# Patient Record
Sex: Female | Born: 1990 | Race: Black or African American | Hispanic: No | Marital: Married | Smoking: Former smoker
Health system: Southern US, Community
[De-identification: ages and names within clinical notes are randomized; demographics above are authoritative.]

## PROBLEM LIST (undated history)

## (undated) ENCOUNTER — Inpatient Hospital Stay (HOSPITAL_COMMUNITY): Payer: Self-pay

## (undated) DIAGNOSIS — Z789 Other specified health status: Secondary | ICD-10-CM

## (undated) DIAGNOSIS — Z8742 Personal history of other diseases of the female genital tract: Secondary | ICD-10-CM

## (undated) DIAGNOSIS — Z30017 Encounter for initial prescription of implantable subdermal contraceptive: Secondary | ICD-10-CM

## (undated) DIAGNOSIS — Z3046 Encounter for surveillance of implantable subdermal contraceptive: Secondary | ICD-10-CM

## (undated) HISTORY — DX: Personal history of other diseases of the female genital tract: Z87.42

## (undated) HISTORY — DX: Encounter for surveillance of implantable subdermal contraceptive: Z30.46

## (undated) HISTORY — DX: Encounter for initial prescription of implantable subdermal contraceptive: Z30.017

## (undated) HISTORY — DX: Other specified health status: Z78.9

## (undated) HISTORY — PX: KNEE SURGERY: SHX244

---
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
## 2011-03-01 ENCOUNTER — Other Ambulatory Visit: Payer: Self-pay | Admitting: Family Medicine

## 2011-03-01 ENCOUNTER — Other Ambulatory Visit (HOSPITAL_COMMUNITY)
Admission: RE | Admit: 2011-03-01 | Discharge: 2011-03-01 | Disposition: A | Discharge status: Home / Self Care | Payer: 59 | Source: Ambulatory Visit | Attending: Obstetrics and Gynecology | Admitting: Obstetrics and Gynecology

## 2011-03-01 DIAGNOSIS — Z113 Encounter for screening for infections with a predominantly sexual mode of transmission: Secondary | ICD-10-CM | POA: Insufficient documentation

## 2011-03-01 DIAGNOSIS — Z01419 Encounter for gynecological examination (general) (routine) without abnormal findings: Secondary | ICD-10-CM | POA: Insufficient documentation

## 2013-10-15 ENCOUNTER — Other Ambulatory Visit (HOSPITAL_COMMUNITY)
Admission: RE | Admit: 2013-10-15 | Discharge: 2013-10-15 | Disposition: A | Discharge status: Home / Self Care | Payer: Medicaid Other | Source: Ambulatory Visit | Attending: Advanced Practice Midwife | Admitting: Advanced Practice Midwife

## 2013-10-15 ENCOUNTER — Ambulatory Visit (INDEPENDENT_AMBULATORY_CARE_PROVIDER_SITE_OTHER): Payer: Medicaid Other | Admitting: Advanced Practice Midwife

## 2013-10-15 ENCOUNTER — Encounter: Payer: Self-pay | Admitting: Advanced Practice Midwife

## 2013-10-15 VITALS — BP 130/80 | Ht 62.5 in | Wt 152.0 lb

## 2013-10-15 DIAGNOSIS — Z113 Encounter for screening for infections with a predominantly sexual mode of transmission: Secondary | ICD-10-CM | POA: Insufficient documentation

## 2013-10-15 DIAGNOSIS — Z3049 Encounter for surveillance of other contraceptives: Secondary | ICD-10-CM

## 2013-10-15 DIAGNOSIS — Z01419 Encounter for gynecological examination (general) (routine) without abnormal findings: Secondary | ICD-10-CM | POA: Insufficient documentation

## 2013-10-15 MED ORDER — NORETHIN ACE-ETH ESTRAD-FE 1-20 MG-MCG(24) PO TABS: 1.0000 | ORAL_TABLET | Freq: Every day | ORAL | Status: DC

## 2013-10-15 NOTE — Progress Notes (Signed)
Morgan Conrad 22 y.o.  Filed Vitals:   10/15/13 1337  BP: 130/80     Past Medical History: History reviewed. No pertinent past medical history.  Past Surgical History: Past Surgical History  Procedure Laterality Date  . Knee surgery Right     Family History: Family History  Problem Relation Age of Onset  . Hypertension Maternal Grandfather   . Diabetes Maternal Grandfather     Social History: History  Substance Use Topics  . Smoking status: Never Smoker   . Smokeless tobacco: Never Used  . Alcohol Use: No    Allergies: No Known Allergies   History of Present Illness: Presents with vaginal bleeding since 10/31. Sometimes a heavier flow than others. Denies passing any clots or abdominal pain. Nexplanon since April 2012, has never had this problem before. Patient interested in having the implant removed and placed on pills. Getting married in the near future and interested in starting a family.    Current reviewed in [facility] electronic medical record.     Review of Systems   Patient denies any headaches, blurred vision, shortness of breath, chest pain, abdominal pain, problems with bowel movements, urination, or intercourse.   Physical Exam: General:  Well developed, well nourished, no acute distress Skin:  Warm and dry Neck:  Midline trachea, normal thyroid Lungs; Clear to auscultation bilaterally Breast:  No dominant palpable mass, retraction, or nipple discharge Cardiovascular: Regular rate and rhythm Abdomen:  Soft, non tender, no hepatosplenomegaly Pelvic:  External genitalia is normal in appearance.  The vagina is normal in appearance.The cervix is bulbous.  Uterus is felt to be normal size, shape, and contour.  No adnexal masses or tenderness noted. Rectal: Good sphincter tone, no polyps, or hemorrhoids felt.   Extremities:  No swelling or varicosities noted Psych:  No mood changes   Impression: Abnormal uterine bleeding due to  Nexplanon     Plan: Discussed Meg[facility] vs. removing Nexplanon. Patient desires to have implant removed and start pills Lo loestrin sent to Pharmacy Pap smear collected.  GC/Chlamydia pending HIV and RPR drawn F/u asap for Nexplanon removal.

## 2013-10-16 LAB — HIV ANTIBODY (ROUTINE TESTING W REFLEX): HIV: NONREACTIVE

## 2013-10-21 ENCOUNTER — Encounter: Payer: Self-pay | Admitting: Adult Health

## 2013-10-21 ENCOUNTER — Ambulatory Visit (INDEPENDENT_AMBULATORY_CARE_PROVIDER_SITE_OTHER): Payer: Medicaid Other | Admitting: Adult Health

## 2013-10-21 VITALS — BP 108/52 | Ht 62.5 in | Wt 156.0 lb

## 2013-10-21 DIAGNOSIS — Z3046 Encounter for surveillance of implantable subdermal contraceptive: Secondary | ICD-10-CM | POA: Insufficient documentation

## 2013-10-21 HISTORY — DX: Encounter for surveillance of implantable subdermal contraceptive: Z30.46

## 2013-10-21 NOTE — Progress Notes (Signed)
Subjective:     Patient ID: Morgan Conrad, female   DOB: [birth year], 22 y.o.   MRN: [id number]  HPI Morgan Conrad is a 22 year old black female in to get nexplanon removed,she is already on the pill.  Review of Systems See HPI Reviewed past medical,surgical, social and family history. Reviewed medications and allergies.     Objective:   Physical Exam BP 108/52  Ht 5' 2.5" (1.588 m)  Wt 156 lb (70.761 kg)  BMI 28.06 kg/m2  LMP 10/31/2014Verbal consent obtained to remove nexplanon.  Left arm cleansed with betadine, and injected with 1.5 cc 2% lidocaine and waited til numb.Under sterile technique a #11 blade was used to make small vertical incision, and a curved forceps was used to easily remove rod. Steri strips applied. Pressure dressing applied. Assessment:     Nexplanon removal    Plan:    Use condoms x 4 weeks, keep clean and dry x 24 hours, no heavy lifting, keep steri strips on x 72 hours, Keep pressure dressing on x 24 hours. Follow up prn problems.

## 2013-10-21 NOTE — Patient Instructions (Signed)
Use condoms x 4 weeks, keep clean and dry x 24 hours, no heavy lifting, keep steri strips on x 72 hours, Keep pressure dressing on x 24 hours. Follow up prn problems.  

## 2013-10-22 ENCOUNTER — Other Ambulatory Visit: Payer: Self-pay | Admitting: Advanced Practice Midwife

## 2013-10-22 ENCOUNTER — Ambulatory Visit: Payer: Self-pay | Admitting: Advanced Practice Midwife

## 2013-12-04 ENCOUNTER — Encounter: Payer: Self-pay | Admitting: Nurse Practitioner

## 2013-12-04 ENCOUNTER — Ambulatory Visit (INDEPENDENT_AMBULATORY_CARE_PROVIDER_SITE_OTHER): Payer: Medicaid Other | Admitting: Nurse Practitioner

## 2013-12-04 VITALS — Ht 62.5 in | Wt 160.0 lb

## 2013-12-04 DIAGNOSIS — N912 Amenorrhea, unspecified: Secondary | ICD-10-CM

## 2013-12-04 DIAGNOSIS — N926 Irregular menstruation, unspecified: Secondary | ICD-10-CM

## 2013-12-04 LAB — HCG, QUANTITATIVE, PREGNANCY: hCG, Beta Chain, Quant, S: 4335 m[IU]/mL

## 2013-12-04 LAB — POCT URINE PREGNANCY: Preg Test, Ur: POSITIVE

## 2013-12-05 ENCOUNTER — Telehealth: Payer: Self-pay | Admitting: Family Medicine

## 2013-12-05 NOTE — Telephone Encounter (Signed)
Patient notified and verbalized understanding of results. She is going to [facility]

## 2013-12-05 NOTE — Telephone Encounter (Signed)
Note has been sent to nurses box

## 2013-12-05 NOTE — Progress Notes (Signed)
Patient notified and verbalized understanding of results. She is going to [facility] 

## 2013-12-05 NOTE — Telephone Encounter (Signed)
Patient calling to get results to her lab work from yesterday.

## 2013-12-06 ENCOUNTER — Encounter: Payer: Self-pay | Admitting: Nurse Practitioner

## 2013-12-06 NOTE — Progress Notes (Signed)
Subjective:  Presents for complaints of no menstrual cycle since 10/31. Patient had her Nexplanon removed on 11/25. Has been having unprotected sex since then. Same sexual partner. No fever vaginal discharge. No pelvic pain.  Objective:   Ht 5' 2.5" (1.588 m)  Wt 160 lb (72.576 kg)  BMI 28.78 kg/m2  LMP 09/26/2013 NAD. Alert, oriented. Lungs clear. Heart regular rhythm. Abdomen soft nondistended nontender. Urine hCG positive.  Assessment:Absence of menstruation - Plan: POCT urine pregnancy, hCG, quantitative, pregnancy  Plan: Strongly recommend the patient quit smoking. Will obtain quantitative hCG today. Patient plans to continue pregnancy. Discussed early prenatal issues including starting her prenatal vitamins. Patient to set up appointment to begin her prenatal care.

## 2013-12-23 ENCOUNTER — Encounter: Payer: Self-pay | Admitting: Adult Health

## 2013-12-23 ENCOUNTER — Encounter (INDEPENDENT_AMBULATORY_CARE_PROVIDER_SITE_OTHER): Payer: Self-pay

## 2013-12-23 ENCOUNTER — Ambulatory Visit (INDEPENDENT_AMBULATORY_CARE_PROVIDER_SITE_OTHER): Payer: Self-pay | Admitting: Adult Health

## 2013-12-23 VITALS — BP 130/80 | Ht 62.5 in | Wt 162.0 lb

## 2013-12-23 DIAGNOSIS — Z3490 Encounter for supervision of normal pregnancy, unspecified, unspecified trimester: Secondary | ICD-10-CM

## 2013-12-23 DIAGNOSIS — Z640 Problems related to unwanted pregnancy: Secondary | ICD-10-CM

## 2013-12-23 NOTE — Patient Instructions (Signed)
Numbers given for abortion clinics Call prn or make appt if changes mind

## 2013-12-23 NOTE — Progress Notes (Signed)
Subjective:     Patient ID: Morgan Conrad, female   DOB: 10-Apr-1991, 23 y.o.   MRN: [id number]  HPI Morgan Conrad is a 23 year old black female, with unexpected pregnancy and she thinks she wants to terminate,She had nexplanon removed in November and was on the pill but was late taking them, her LMP was !12/30/12.  Review of Systems See HPI Reviewed past medical,surgical, social and family history. Reviewed medications and allergies.     Objective:   Physical Exam BP 130/80  Ht 5' 2.5" (1.588 m)  Wt 162 lb (73.483 kg)  BMI 29.14 kg/m2  LMP 10/29/2013   US showed IUP with CRL 1.27 cm 7+5 weeks matches EDD with LMP 08/05/14 had FHR 167 and ovarian cyst noted.Numbers given for several abortion clinic for her to call. Mom with her and supportive.  Assessment:     Pregnant, may terminate    Plan:    call clinics to talk with them about procedure Call prn or make appt if changes mind

## 2013-12-24 ENCOUNTER — Telehealth: Payer: Self-pay | Admitting: Adult Health

## 2013-12-24 NOTE — Telephone Encounter (Signed)
Mom called pt going to [facility] Friday to get the pill to terminate.Will cost about $400

## 2013-12-29 ENCOUNTER — Other Ambulatory Visit: Payer: Medicaid Other

## 2014-01-09 ENCOUNTER — Ambulatory Visit (INDEPENDENT_AMBULATORY_CARE_PROVIDER_SITE_OTHER): Payer: Self-pay | Admitting: Adult Health

## 2014-01-09 ENCOUNTER — Encounter: Payer: Self-pay | Admitting: Adult Health

## 2014-01-09 ENCOUNTER — Ambulatory Visit: Payer: Medicaid Other | Admitting: Obstetrics and Gynecology

## 2014-01-09 VITALS — BP 120/68 | Ht 62.5 in | Wt 160.5 lb

## 2014-01-09 DIAGNOSIS — Z8742 Personal history of other diseases of the female genital tract: Secondary | ICD-10-CM

## 2014-01-09 HISTORY — DX: Personal history of other diseases of the female genital tract: Z87.42

## 2014-01-09 LAB — POCT URINE PREGNANCY: Preg Test, Ur: POSITIVE

## 2014-01-09 NOTE — Progress Notes (Signed)
Subjective:     Patient ID: Morgan Conrad, female   DOB: 08-01-1991, 23 y.o.   MRN: [id number]  HPI Morgan Conrad is a 23 year old black female who was seen in [place] and took abortion pill on 130/15 and had bleeding and passed tissue and is taking Minastrin.No complaints, no sex yet, he is in basic training. Blood type O+ per pt.  Review of Systems See HPI Reviewed past medical,surgical, social and family history. Reviewed medications and allergies.     Objective:   Physical Exam BP 120/68  Ht 5' 2.5" (1.588 m)  Wt 160 lb 8 oz (72.802 kg)  BMI 28.87 kg/m2  LMP 10/29/2013  Breastfeeding? UnknownUPT lightly positive, Skin warm and dry.Pelvic: external genitalia is normal in appearance, vagina: white discharge without odor, cervix:smooth, uterus: normal size, shape and contour, non tender, no masses felt, adnexa: no masses or tenderness noted.     Assessment:     Sp termination of pregnancy    Plan:     Continue the minastrin  Follow up in 2 weeks for UPT

## 2014-01-09 NOTE — Patient Instructions (Signed)
Take pills daily Follow up in 2 weeks

## 2014-01-23 ENCOUNTER — Ambulatory Visit (INDEPENDENT_AMBULATORY_CARE_PROVIDER_SITE_OTHER): Payer: Medicaid Other | Admitting: Adult Health

## 2014-01-23 ENCOUNTER — Encounter: Payer: Self-pay | Admitting: Adult Health

## 2014-01-23 VITALS — BP 110/70 | Ht 62.5 in | Wt 157.0 lb

## 2014-01-23 DIAGNOSIS — Z3009 Encounter for other general counseling and advice on contraception: Secondary | ICD-10-CM

## 2014-01-23 DIAGNOSIS — Z3201 Encounter for pregnancy test, result positive: Secondary | ICD-10-CM

## 2014-01-23 DIAGNOSIS — Z8742 Personal history of other diseases of the female genital tract: Secondary | ICD-10-CM

## 2014-01-23 NOTE — Progress Notes (Signed)
Subjective:     Patient ID: Morgan Conrad, female   DOB: [birth year], 23 y.o.   MRN: [id number]  HPI Morgan Conrad is back for pregnancy test, sp termination, was lightly + 2 weeks ago.No complaints,Mom said she is doing well.  Review of Systems See HPI Reviewed past medical,surgical, social and family history. Reviewed medications and allergies.     Objective:   Physical Exam BP 110/70  Ht 5' 2.5" (1.588 m)  Wt 157 lb (71.215 kg)  BMI 28.24 kg/m2UPT lightly positive still,   still taking OCs, will get [facility]  Assessment:     Sp termination    Plan:     Check QHCG, will talk Monday  Continue OCs Return in 1 week for follow up [facility]

## 2014-01-23 NOTE — Patient Instructions (Signed)
Return in 1 week for labs Continue OCs

## 2014-01-24 LAB — HCG, QUANTITATIVE, PREGNANCY: hCG, Beta Chain, Quant, S: 28.9 m[IU]/mL

## 2014-01-26 ENCOUNTER — Telehealth: Payer: Self-pay | Admitting: Adult Health

## 2014-01-26 LAB — POCT URINE PREGNANCY: Preg Test, Ur: POSITIVE

## 2014-01-26 NOTE — Telephone Encounter (Signed)
Pt aware QHCG 28.9 which is good sp el ab

## 2014-01-30 ENCOUNTER — Other Ambulatory Visit: Payer: Medicaid Other

## 2014-01-30 ENCOUNTER — Encounter (INDEPENDENT_AMBULATORY_CARE_PROVIDER_SITE_OTHER): Payer: Self-pay

## 2014-01-30 DIAGNOSIS — O039 Complete or unspecified spontaneous abortion without complication: Secondary | ICD-10-CM

## 2014-01-31 LAB — HCG, QUANTITATIVE, PREGNANCY: hCG, Beta Chain, Quant, S: 16.4 m[IU]/mL

## 2014-02-02 ENCOUNTER — Telehealth: Payer: Self-pay | Admitting: Adult Health

## 2014-02-02 NOTE — Telephone Encounter (Signed)
No answer

## 2014-07-24 ENCOUNTER — Other Ambulatory Visit: Payer: Self-pay | Admitting: Obstetrics and Gynecology

## 2014-07-24 DIAGNOSIS — O3680X0 Pregnancy with inconclusive fetal viability, not applicable or unspecified: Secondary | ICD-10-CM

## 2014-07-29 ENCOUNTER — Other Ambulatory Visit: Payer: Self-pay | Admitting: Obstetrics and Gynecology

## 2014-07-29 ENCOUNTER — Ambulatory Visit (INDEPENDENT_AMBULATORY_CARE_PROVIDER_SITE_OTHER)

## 2014-07-29 DIAGNOSIS — O26849 Uterine size-date discrepancy, unspecified trimester: Secondary | ICD-10-CM

## 2014-07-29 DIAGNOSIS — O3680X0 Pregnancy with inconclusive fetal viability, not applicable or unspecified: Secondary | ICD-10-CM

## 2014-07-29 NOTE — Progress Notes (Signed)
U/S-single IUP with +FCA noted FHR-99 bpm, CRL c/w 5+3wks EDD 03/28/2015, cx appears closed, bilateral adnexa appears WNL

## 2014-08-12 ENCOUNTER — Ambulatory Visit (INDEPENDENT_AMBULATORY_CARE_PROVIDER_SITE_OTHER): Payer: Medicaid Other | Admitting: Women's Health

## 2014-08-12 ENCOUNTER — Encounter: Payer: Self-pay | Admitting: Women's Health

## 2014-08-12 VITALS — BP 112/62 | Wt 147.0 lb

## 2014-08-12 DIAGNOSIS — Z3481 Encounter for supervision of other normal pregnancy, first trimester: Secondary | ICD-10-CM

## 2014-08-12 DIAGNOSIS — Z331 Pregnant state, incidental: Secondary | ICD-10-CM | POA: Diagnosis not present

## 2014-08-12 DIAGNOSIS — Z1389 Encounter for screening for other disorder: Secondary | ICD-10-CM | POA: Diagnosis not present

## 2014-08-12 DIAGNOSIS — Z0184 Encounter for antibody response examination: Secondary | ICD-10-CM

## 2014-08-12 DIAGNOSIS — Z0189 Encounter for other specified special examinations: Secondary | ICD-10-CM

## 2014-08-12 DIAGNOSIS — Z36 Encounter for antenatal screening of mother: Secondary | ICD-10-CM

## 2014-08-12 DIAGNOSIS — Z3480 Encounter for supervision of other normal pregnancy, unspecified trimester: Secondary | ICD-10-CM | POA: Diagnosis not present

## 2014-08-12 DIAGNOSIS — Z130 Encounter for screening for diseases of the blood and blood-forming organs and certain disorders involving the immune mechanism: Secondary | ICD-10-CM

## 2014-08-12 DIAGNOSIS — O210 Mild hyperemesis gravidarum: Secondary | ICD-10-CM | POA: Diagnosis not present

## 2014-08-12 DIAGNOSIS — O09899 Supervision of other high risk pregnancies, unspecified trimester: Secondary | ICD-10-CM | POA: Insufficient documentation

## 2014-08-12 DIAGNOSIS — Z1159 Encounter for screening for other viral diseases: Secondary | ICD-10-CM

## 2014-08-12 DIAGNOSIS — Z1371 Encounter for nonprocreative screening for genetic disease carrier status: Secondary | ICD-10-CM

## 2014-08-12 LAB — POCT URINALYSIS DIPSTICK
Blood, UA: NEGATIVE
Glucose, UA: NEGATIVE
Ketones, UA: NEGATIVE
Leukocytes, UA: NEGATIVE
Nitrite, UA: NEGATIVE
Protein, UA: NEGATIVE

## 2014-08-12 NOTE — Progress Notes (Signed)
  Subjective:  Morgan Conrad is a 23 y.o. G60P0010 African American female at [redacted]w[redacted]d by 5wk u/s, being seen today for her first obstetrical visit.  Her obstetrical history is significant for tab x 1.  Pregnancy history fully reviewed.  Patient reports some nausea when hungry- improves when she eats- doesn't bother her. Denies vb, cramping, uti s/s, abnormal/malodorous vag d/c, or vulvovaginal itching/irritation.  Currently exercises, runs, zumba- ok to continue everything she has previously been doing- avoid anything that may potentially cause trauma to abd/or cause falls  Drinks 'dieters tea', helps her have bm's- advised to stop. Wears abdominal binder 'to help stomach stay flat'- advised to stop wearing.   BP 112/62  Wt 147 lb (66.679 kg)  LMP 06/15/2014  HISTORY: OB History  Gravida Para Term Preterm AB SAB TAB Ectopic Multiple Living  2    1  1        # Outcome Date GA Lbr Len/2nd Weight Sex Delivery Anes PTL Lv  2 CUR           1 TAB              Past Medical History  Diagnosis Date  . Encounter for Nexplanon removal 10/21/2013  . History of termination of pregnancy 01/09/2014  . Medical history non-contributory    Past Surgical History  Procedure Laterality Date  . Knee surgery Right    Family History  Problem Relation Age of Onset  . Hypertension Maternal Grandfather   . Diabetes Maternal Grandfather     Exam   System:     General: Well developed & nourished, no acute distress   Skin: Warm & dry, normal coloration and turgor, no rashes   Neurologic: Alert & oriented, normal mood   Cardiovascular: Regular rate & rhythm   Respiratory: Effort & rate normal, LCTAB, acyanotic   Abdomen: Soft, non tender   Extremities: normal strength, tone   Pelvic Exam:    Perineum: Normal perineum   Vulva: Normal, no lesions   Vagina:  Normal mucosa, normal discharge   Cervix: Normal, bulbous, appears closed   Uterus: Normal size/shape/contour for GA   Thin prep pap smear Neg  09/2013 FHR: will see tasha for fhr prior to leaving   Assessment:   Pregnancy: G2P0010 Patient Active Problem List   Diagnosis Date Noted  . Supervision of other normal pregnancy 08/12/2014    Priority: High  . History of termination of pregnancy 01/09/2014  . SUBLUXATION PATELLAR (MALALIGNMENT) 08/11/2008    [redacted]w[redacted]d G2P0010 New OB visit Mild nausea    Plan:  Initial labs drawn Continue prenatal vitamins Problem list reviewed and updated Reviewed n/v relief measures and warning s/s to report Constipation prevention/relief measures given in lieu of 'dieters tea' she was using Reviewed recommended weight gain based on pre-gravid BMI Encouraged well-balanced diet Genetic Screening discussed Integrated Screen: requested Cystic fibrosis screening discussed requested Ultrasound discussed; fetal survey: requested Follow up in 4 1/2 weeks for 1st it/nt and visit [facility] completed  [redacted]w[redacted]d CNM, [facility] 08/12/2014 11:14 AM

## 2014-08-12 NOTE — Patient Instructions (Addendum)

## 2014-08-13 LAB — URINALYSIS, ROUTINE W REFLEX MICROSCOPIC
Bilirubin Urine: NEGATIVE
GLUCOSE, UA: NEGATIVE mg/dL
Hgb urine dipstick: NEGATIVE
Ketones, ur: NEGATIVE mg/dL
LEUKOCYTES UA: NEGATIVE
NITRITE: NEGATIVE
PROTEIN: NEGATIVE mg/dL
Specific Gravity, Urine: 1.026 (ref 1.005–1.030)
Urobilinogen, UA: 0.2 mg/dL (ref 0.0–1.0)
pH: 6 (ref 5.0–8.0)

## 2014-08-13 LAB — CYSTIC FIBROSIS DIAGNOSTIC STUDY

## 2014-08-13 LAB — DRUG SCREEN, URINE, NO CONFIRMATION
Amphetamine Screen, Ur: NEGATIVE
Barbiturate Quant, Ur: NEGATIVE
Benzodiazepines.: NEGATIVE
Cocaine Metabolites: NEGATIVE
Creatinine,U: 422.1 mg/dL
Marijuana Metabolite: NEGATIVE
Methadone: NEGATIVE
Opiate Screen, Urine: NEGATIVE
Phencyclidine (PCP): NEGATIVE
Propoxyphene: NEGATIVE

## 2014-08-13 LAB — GC/CHLAMYDIA PROBE AMP
CT Probe RNA: NEGATIVE
GC Probe RNA: NEGATIVE

## 2014-08-13 LAB — CBC
HCT: 40.9 % (ref 36.0–46.0)
Hemoglobin: 13.4 g/dL (ref 12.0–15.0)
MCH: 29.2 pg (ref 26.0–34.0)
MCHC: 32.8 g/dL (ref 30.0–36.0)
MCV: 89.1 fL (ref 78.0–100.0)
Platelets: 327 10*3/uL (ref 150–400)
RBC: 4.59 MIL/uL (ref 3.87–5.11)
RDW: 13.2 % (ref 11.5–15.5)
WBC: 8.7 10*3/uL (ref 4.0–10.5)

## 2014-08-13 LAB — SICKLE CELL SCREEN: Sickle Cell Screen: NEGATIVE

## 2014-08-13 LAB — ANTIBODY SCREEN: Antibody Screen: NEGATIVE

## 2014-08-13 LAB — HIV ANTIBODY (ROUTINE TESTING W REFLEX): HIV 1&2 Ab, 4th Generation: NONREACTIVE

## 2014-08-13 LAB — RUBELLA SCREEN: Rubella: 1.13 Index — ABNORMAL HIGH (ref <0.90)

## 2014-08-13 LAB — VARICELLA ZOSTER ANTIBODY, IGG: Varicella IgG: 617 Index — ABNORMAL HIGH (ref <135.00)

## 2014-08-13 LAB — ABO AND RH: Rh Type: POSITIVE

## 2014-08-13 LAB — RPR

## 2014-08-13 LAB — URINE CULTURE
COLONY COUNT: NO GROWTH
Organism ID, Bacteria: NO GROWTH

## 2014-08-13 LAB — OXYCODONE SCREEN, UA, RFLX CONFIRM: Oxycodone Screen, Ur: NEGATIVE ng/mL

## 2014-08-13 LAB — HEPATITIS B SURFACE ANTIGEN: Hepatitis B Surface Ag: NEGATIVE

## 2014-08-17 ENCOUNTER — Encounter: Payer: Self-pay | Admitting: Women's Health

## 2014-09-09 ENCOUNTER — Ambulatory Visit (INDEPENDENT_AMBULATORY_CARE_PROVIDER_SITE_OTHER): Payer: Medicaid Other | Admitting: Women's Health

## 2014-09-09 ENCOUNTER — Ambulatory Visit (INDEPENDENT_AMBULATORY_CARE_PROVIDER_SITE_OTHER)

## 2014-09-09 VITALS — BP 102/70 | Wt 151.0 lb

## 2014-09-09 DIAGNOSIS — Z3682 Encounter for antenatal screening for nuchal translucency: Secondary | ICD-10-CM

## 2014-09-09 DIAGNOSIS — Z1389 Encounter for screening for other disorder: Secondary | ICD-10-CM | POA: Diagnosis not present

## 2014-09-09 DIAGNOSIS — Z3491 Encounter for supervision of normal pregnancy, unspecified, first trimester: Secondary | ICD-10-CM

## 2014-09-09 DIAGNOSIS — Z36 Encounter for antenatal screening of mother: Secondary | ICD-10-CM | POA: Diagnosis not present

## 2014-09-09 DIAGNOSIS — Z331 Pregnant state, incidental: Secondary | ICD-10-CM

## 2014-09-09 LAB — POCT URINALYSIS DIPSTICK
Blood, UA: NEGATIVE
Glucose, UA: NEGATIVE
LEUKOCYTES UA: NEGATIVE
Nitrite, UA: NEGATIVE
Protein, UA: NEGATIVE

## 2014-09-09 NOTE — Progress Notes (Addendum)
Low-risk OB appointment G2P0010 [redacted]w[redacted]d Estimated Date of Delivery: 03/28/15 BP 102/70  Wt 151 lb (68.493 kg)  LMP 06/15/2014  BP, weight, and urine reviewed.  Refer to obstetrical flow sheet for FH & FHR.  No fm yet. Denies cramping, lof, vb, or uti s/s. No complaints. Reviewed today's normal nt u/s, warning s/s to report. Plan:  Continue routine obstetrical care  F/U in 4wks for OB appointment, 2nd IT 1st it/nt today [facility] offered, accepted, referral faxed

## 2014-09-09 NOTE — Progress Notes (Signed)
U/S(11+3wks)-single active fetus, CRL c/w dates, NB present, NT-1.1mm, FHR- 176 bpm, cx appears closed (3.1cm), bilateral adnexa appears WNL, anterior Gr 0 placenta

## 2014-09-09 NOTE — Patient Instructions (Signed)
First Trimester of Pregnancy The first trimester of pregnancy is from week 1 until the end of week 12 (months 1 through 3). A week after a sperm fertilizes an egg, the egg will implant on the wall of the uterus. This embryo will begin to develop into a baby. Genes from you and your partner are forming the baby. The female genes determine whether the baby is a boy or a girl. At 6-8 weeks, the eyes and face are formed, and the heartbeat can be seen on ultrasound. At the end of 12 weeks, all the baby's organs are formed.  Now that you are pregnant, you will want to do everything you can to have a healthy baby. Two of the most important things are to get good prenatal care and to follow your health care provider's instructions. Prenatal care is all the medical care you receive before the baby's birth. This care will help prevent, find, and treat any problems during the pregnancy and childbirth. BODY CHANGES Your body goes through many changes during pregnancy. The changes vary from woman to woman.   You may gain or lose a couple of pounds at first.  You may feel sick to your stomach (nauseous) and throw up (vomit). If the vomiting is uncontrollable, call your health care provider.  You may tire easily.  You may develop headaches that can be relieved by medicines approved by your health care provider.  You may urinate more often. Painful urination may mean you have a bladder infection.  You may develop heartburn as a result of your pregnancy.  You may develop constipation because certain hormones are causing the muscles that push waste through your intestines to slow down.  You may develop hemorrhoids or swollen, bulging veins (varicose veins).  Your breasts may begin to grow larger and become tender. Your nipples may stick out more, and the tissue that surrounds them (areola) may become darker.  Your gums may bleed and may be sensitive to brushing and flossing.  Dark spots or blotches (chloasma,  mask of pregnancy) may develop on your face. This will likely fade after the baby is born.  Your menstrual periods will stop.  You may have a loss of appetite.  You may develop cravings for certain kinds of food.  You may have changes in your emotions from day to day, such as being excited to be pregnant or being concerned that something may go wrong with the pregnancy and baby.  You may have more vivid and strange dreams.  You may have changes in your hair. These can include thickening of your hair, rapid growth, and changes in texture. Some women also have hair loss during or after pregnancy, or hair that feels dry or thin. Your hair will most likely return to normal after your baby is born. WHAT TO EXPECT AT YOUR PRENATAL VISITS During a routine prenatal visit:  You will be weighed to make sure you and the baby are growing normally.  Your blood pressure will be taken.  Your abdomen will be measured to track your baby's growth.  The fetal heartbeat will be listened to starting around week 10 or 12 of your pregnancy.  Test results from any previous visits will be discussed. Your health care provider may ask you:  How you are feeling.  If you are feeling the baby move.  If you have had any abnormal symptoms, such as leaking fluid, bleeding, severe headaches, or abdominal cramping.  If you have any questions. Other tests   that may be performed during your first trimester include:  Blood tests to find your blood type and to check for the presence of any previous infections. They will also be used to check for low iron levels (anemia) and Rh antibodies. Later in the pregnancy, blood tests for diabetes will be done along with other tests if problems develop.  Urine tests to check for infections, diabetes, or protein in the urine.  An ultrasound to confirm the proper growth and development of the baby.  An amniocentesis to check for possible genetic problems.  Fetal screens for  spina bifida and Down syndrome.  You may need other tests to make sure you and the baby are doing well. HOME CARE INSTRUCTIONS  Medicines  Follow your health care provider's instructions regarding medicine use. Specific medicines may be either safe or unsafe to take during pregnancy.  Take your prenatal vitamins as directed.  If you develop constipation, try taking a stool softener if your health care provider approves. Diet  Eat regular, well-balanced meals. Choose a variety of foods, such as meat or vegetable-based protein, fish, milk and low-fat dairy products, vegetables, fruits, and whole grain breads and cereals. Your health care provider will help you determine the amount of weight gain that is right for you.  Avoid raw meat and uncooked cheese. These carry germs that can cause birth defects in the baby.  Eating four or five small meals rather than three large meals a day may help relieve nausea and vomiting. If you start to feel nauseous, eating a few soda crackers can be helpful. Drinking liquids between meals instead of during meals also seems to help nausea and vomiting.  If you develop constipation, eat more high-fiber foods, such as fresh vegetables or fruit and whole grains. Drink enough fluids to keep your urine clear or pale yellow. Activity and Exercise  Exercise only as directed by your health care provider. Exercising will help you:  Control your weight.  Stay in shape.  Be prepared for labor and delivery.  Experiencing pain or cramping in the lower abdomen or low back is a good sign that you should stop exercising. Check with your health care provider before continuing normal exercises.  Try to avoid standing for long periods of time. Move your legs often if you must stand in one place for a long time.  Avoid heavy lifting.  Wear low-heeled shoes, and practice good posture.  You may continue to have sex unless your health care provider directs you  otherwise. Relief of Pain or Discomfort  Wear a good support bra for breast tenderness.   Take warm sitz baths to soothe any pain or discomfort caused by hemorrhoids. Use hemorrhoid cream if your health care provider approves.   Rest with your legs elevated if you have leg cramps or low back pain.  If you develop varicose veins in your legs, wear support hose. Elevate your feet for 15 minutes, 3-4 times a day. Limit salt in your diet. Prenatal Care  Schedule your prenatal visits by the twelfth week of pregnancy. They are usually scheduled monthly at first, then more often in the last 2 months before delivery.  Write down your questions. Take them to your prenatal visits.  Keep all your prenatal visits as directed by your health care provider. Safety  Wear your seat belt at all times when driving.  Make a list of emergency phone numbers, including numbers for family, friends, the hospital, and police and fire departments. General Tips    Ask your health care provider for a referral to a local prenatal education class. Begin classes no later than at the beginning of month 6 of your pregnancy.  Ask for help if you have counseling or nutritional needs during pregnancy. Your health care provider can offer advice or refer you to specialists for help with various needs.  Do not use hot tubs, steam rooms, or saunas.  Do not douche or use tampons or scented sanitary pads.  Do not cross your legs for long periods of time.  Avoid cat litter boxes and soil used by cats. These carry germs that can cause birth defects in the baby and possibly loss of the fetus by miscarriage or stillbirth.  Avoid all smoking, herbs, alcohol, and medicines not prescribed by your health care provider. Chemicals in these affect the formation and growth of the baby.  Schedule a dentist appointment. At home, brush your teeth with a soft toothbrush and be gentle when you floss. SEEK MEDICAL CARE IF:   You have  dizziness.  You have mild pelvic cramps, pelvic pressure, or nagging pain in the abdominal area.  You have persistent nausea, vomiting, or diarrhea.  You have a bad smelling vaginal discharge.  You have pain with urination.  You notice increased swelling in your face, hands, legs, or ankles. SEEK IMMEDIATE MEDICAL CARE IF:   You have a fever.  You are leaking fluid from your vagina.  You have spotting or bleeding from your vagina.  You have severe abdominal cramping or pain.  You have rapid weight gain or loss.  You vomit blood or material that looks like coffee grounds.  You are exposed to German measles and have never had them.  You are exposed to fifth disease or chickenpox.  You develop a severe headache.  You have shortness of breath.  You have any kind of trauma, such as from a fall or a car accident. Document Released: 11/07/2001 Document Revised: 03/30/2014 Document Reviewed: 09/23/2013 Exit[employer] Patient Information 2015 [employer]. This information is not intended to replace advice given to you by your health care provider. Make sure you discuss any questions you have with your health care provider.  

## 2014-09-12 LAB — MATERNAL SCREEN, INTEGRATED #1

## 2014-09-28 ENCOUNTER — Encounter: Payer: Self-pay | Admitting: Women's Health

## 2014-10-07 ENCOUNTER — Encounter: Payer: Self-pay | Admitting: Advanced Practice Midwife

## 2014-10-07 ENCOUNTER — Ambulatory Visit (INDEPENDENT_AMBULATORY_CARE_PROVIDER_SITE_OTHER): Payer: Medicaid Other | Admitting: Advanced Practice Midwife

## 2014-10-07 VITALS — BP 110/62 | Wt 156.5 lb

## 2014-10-07 DIAGNOSIS — Z1389 Encounter for screening for other disorder: Secondary | ICD-10-CM | POA: Diagnosis not present

## 2014-10-07 DIAGNOSIS — Z3482 Encounter for supervision of other normal pregnancy, second trimester: Secondary | ICD-10-CM

## 2014-10-07 DIAGNOSIS — Z3682 Encounter for antenatal screening for nuchal translucency: Secondary | ICD-10-CM

## 2014-10-07 DIAGNOSIS — Z331 Pregnant state, incidental: Secondary | ICD-10-CM

## 2014-10-07 LAB — POCT URINALYSIS DIPSTICK
Blood, UA: NEGATIVE
Glucose, UA: NEGATIVE
Ketones, UA: NEGATIVE
LEUKOCYTES UA: NEGATIVE
Nitrite, UA: NEGATIVE
PROTEIN UA: NEGATIVE

## 2014-10-07 NOTE — Progress Notes (Signed)
G2P0010 [redacted]w[redacted]d Estimated Date of Delivery: 03/28/15  Blood pressure 110/62, weight 156 lb 8 oz (70.988 kg), last menstrual period 06/15/2014.   BP weight and urine results all reviewed and noted.  Please refer to the obstetrical flow sheet for the fundal height and fetal heart rate documentation:  Patient reports good fetal movement, denies any bleeding and no rupture of membranes symptoms or regular contractions. Patient is without complaints. All questions were answered.  Plan:  Continued routine obstetrical care, 2nd IT  Follow up in 4 weeks for OB appointment, anatomy scan    \

## 2014-10-12 ENCOUNTER — Encounter: Payer: Self-pay | Admitting: Women's Health

## 2014-10-12 ENCOUNTER — Telehealth: Payer: Self-pay | Admitting: *Deleted

## 2014-10-12 ENCOUNTER — Ambulatory Visit (INDEPENDENT_AMBULATORY_CARE_PROVIDER_SITE_OTHER): Payer: Medicaid Other | Admitting: Women's Health

## 2014-10-12 VITALS — BP 122/70 | Wt 159.0 lb

## 2014-10-12 DIAGNOSIS — Z3492 Encounter for supervision of normal pregnancy, unspecified, second trimester: Secondary | ICD-10-CM | POA: Diagnosis not present

## 2014-10-12 DIAGNOSIS — Z720 Tobacco use: Secondary | ICD-10-CM

## 2014-10-12 DIAGNOSIS — Z1389 Encounter for screening for other disorder: Secondary | ICD-10-CM | POA: Diagnosis not present

## 2014-10-12 DIAGNOSIS — Z331 Pregnant state, incidental: Secondary | ICD-10-CM

## 2014-10-12 DIAGNOSIS — F17200 Nicotine dependence, unspecified, uncomplicated: Secondary | ICD-10-CM | POA: Insufficient documentation

## 2014-10-12 LAB — MATERNAL SCREEN, INTEGRATED #2
AFP MOM MAT SCREEN: 1.85
AFP, SERUM MAT SCREEN: 63.5 ng/mL
Age risk Down Syndrome: 1:1100 {titer}
Calculated Gestational Age: 15.6
Crown Rump Length: 51.4 mm
ESTRIOL FREE MAT SCREEN: 0.8 ng/mL
Estriol Mom: 1.12
HCG, MOM MAT SCREEN: 1.08
HCG, SERUM MAT SCREEN: 48.9 [IU]/mL
Inhibin A Dimeric: 299 pg/mL
Inhibin A MoM: 1.78
MSS Down Syndrome: <1:5000 {titer}
MSS Trisomy 18 Risk: <1:5000 {titer}
NT MoM: 1.29
NUCHAL TRANSLUCENCY MAT SCREEN 2: 1.61 mm
Number of fetuses: 1
PAPP-A MoM: 0.86
PAPP-A: 536 ng/mL
Rish for ONTD: 1:650 {titer}

## 2014-10-12 LAB — POCT URINALYSIS DIPSTICK
Blood, UA: NEGATIVE
Glucose, UA: NEGATIVE
KETONES UA: NEGATIVE
Leukocytes, UA: NEGATIVE
NITRITE UA: NEGATIVE
Protein, UA: NEGATIVE

## 2014-10-12 NOTE — Telephone Encounter (Signed)
Pt states "been really depressed lately and crying, unsure why", c/o abdominal pain "like a pulling" no vaginal bleeding. Call transferred to front staff for pt to be work in to schedule today.

## 2014-10-12 NOTE — Patient Instructions (Signed)
Smoking Cessation, Tips for Success  If you are ready to quit smoking, congratulations! You have chosen to help yourself be healthier. Cigarettes bring nicotine, tar, carbon monoxide, and other irritants into your body. Your lungs, heart, and blood vessels will be able to work better without these poisons. There are many different ways to quit smoking. Nicotine gum, nicotine patches, a nicotine inhaler, or nicotine nasal spray can help with physical craving. Hypnosis, support groups, and medicines help break the habit of smoking.  WHAT THINGS CAN I DO TO MAKE QUITTING EASIER?   Here are some tips to help you quit for good:  · Pick a date when you will quit smoking completely. Tell all of your friends and family about your plan to quit on that date.  · Do not try to slowly cut down on the number of cigarettes you are smoking. Pick a quit date and quit smoking completely starting on that day.  · Throw away all cigarettes.    · Clean and remove all ashtrays from your home, work, and car.  · On a card, write down your reasons for quitting. Carry the card with you and read it when you get the urge to smoke.  · Cleanse your body of nicotine. Drink enough water and fluids to keep your urine clear or pale yellow. Do this after quitting to flush the nicotine from your body.  · Learn to predict your moods. Do not let a bad situation be your excuse to have a cigarette. Some situations in your life might tempt you into wanting a cigarette.  · Never have "just one" cigarette. It leads to wanting another and another. Remind yourself of your decision to quit.  · Change habits associated with smoking. If you smoked while driving or when feeling stressed, try other activities to replace smoking. Stand up when drinking your coffee. Brush your teeth after eating. Sit in a different chair when you read the paper. Avoid alcohol while trying to quit, and try to drink fewer caffeinated beverages. Alcohol and caffeine may urge you to  smoke.  · Avoid foods and drinks that can trigger a desire to smoke, such as sugary or spicy foods and alcohol.  · Ask people who smoke not to smoke around you.  · Have something planned to do right after eating or having a cup of coffee. For example, plan to take a walk or exercise.  · Try a relaxation exercise to calm you down and decrease your stress. Remember, you may be tense and nervous for the first 2 weeks after you quit, but this will pass.  · Find new activities to keep your hands busy. Play with a pen, coin, or rubber band. Doodle or draw things on paper.  · Brush your teeth right after eating. This will help cut down on the craving for the taste of tobacco after meals. You can also try mouthwash.    · Use oral substitutes in place of cigarettes. Try using lemon drops, carrots, cinnamon sticks, or chewing gum. Keep them handy so they are available when you have the urge to smoke.  · When you have the urge to smoke, try deep breathing.  · Designate your home as a nonsmoking area.  · If you are a heavy smoker, ask your health care provider about a prescription for nicotine chewing gum. It can ease your withdrawal from nicotine.  · Reward yourself. Set aside the cigarette money you save and buy yourself something nice.  · Look for   support from others. Join a support group or smoking cessation program. Ask someone at home or at work to help you with your plan to quit smoking.  · Always ask yourself, "Do I need this cigarette or is this just a reflex?" Tell yourself, "Today, I choose not to smoke," or "I do not want to smoke." You are reminding yourself of your decision to quit.  · Do not replace cigarette smoking with electronic cigarettes (commonly called e-cigarettes). The safety of e-cigarettes is unknown, and some may contain harmful chemicals.  · If you relapse, do not give up! Plan ahead and think about what you will do the next time you get the urge to smoke.  HOW WILL I FEEL WHEN I QUIT SMOKING?  You  may have symptoms of withdrawal because your body is used to nicotine (the addictive substance in cigarettes). You may crave cigarettes, be irritable, feel very hungry, cough often, get headaches, or have difficulty concentrating. The withdrawal symptoms are only temporary. They are strongest when you first quit but will go away within 10-14 days. When withdrawal symptoms occur, stay in control. Think about your reasons for quitting. Remind yourself that these are signs that your body is healing and getting used to being without cigarettes. Remember that withdrawal symptoms are easier to treat than the major diseases that smoking can cause.   Even after the withdrawal is over, expect periodic urges to smoke. However, these cravings are generally short lived and will go away whether you smoke or not. Do not smoke!  WHAT RESOURCES ARE AVAILABLE TO HELP ME QUIT SMOKING?  Your health care provider can direct you to community resources or hospitals for support, which may include:  · Group support.  · Education.  · Hypnosis.  · Therapy.  Document Released: 08/11/2004 Document Revised: 03/30/2014 Document Reviewed: 05/01/2013  [employer]® Patient Information ©2015 [employer]. This information is not intended to replace advice given to you by your health care provider. Make sure you discuss any questions you have with your health care provider.

## 2014-10-12 NOTE — Progress Notes (Signed)
Work-in Low-risk OB appointment G2P0010 [redacted]w[redacted]d Estimated Date of Delivery: 03/28/15 BP 122/70 mmHg  Wt 159 lb (72.122 kg)  LMP 06/15/2014  BP, weight, and urine reviewed.  Refer to obstetrical flow sheet for FH & FHR.  No fm yet. Denies lof, vb, or uti s/s. Work-in for depression. Smoker: 1ppd prior to pregnancy, now 1pack q 3-4 days- didn't tell us before b/c does not want mother to know about this and she has been w/ her at each appt. Pt is very stressed about smoking, wants to quit, has cut back some on her own, but she is feeling depressed that she is still smoking and pregnant- feels like something may be wrong w/ baby b/c of this. Discussed risks to herself and baby w/ smoking. Praised her on cutting back on her own, offered [facility] referral and she accepted, so this was faxed. Denies SI/HI, states this is the only thing she is 'depressed' about. Has occ pulling pains in low abd- no lof, vb, or other sx. Sounds like round ligament pain- reassured.  Reviewed warning s/s to report. Plan:  Continue routine obstetrical care  F/U in 3wks for OB appointment and anatomy u/s as scheduled

## 2014-11-05 ENCOUNTER — Ambulatory Visit (INDEPENDENT_AMBULATORY_CARE_PROVIDER_SITE_OTHER): Payer: Medicaid Other | Admitting: Advanced Practice Midwife

## 2014-11-05 ENCOUNTER — Other Ambulatory Visit: Payer: Self-pay | Admitting: Advanced Practice Midwife

## 2014-11-05 ENCOUNTER — Ambulatory Visit (INDEPENDENT_AMBULATORY_CARE_PROVIDER_SITE_OTHER)

## 2014-11-05 VITALS — BP 124/70 | Wt 166.0 lb

## 2014-11-05 DIAGNOSIS — Z1389 Encounter for screening for other disorder: Secondary | ICD-10-CM

## 2014-11-05 DIAGNOSIS — R1011 Right upper quadrant pain: Secondary | ICD-10-CM

## 2014-11-05 DIAGNOSIS — Z3492 Encounter for supervision of normal pregnancy, unspecified, second trimester: Secondary | ICD-10-CM | POA: Diagnosis not present

## 2014-11-05 DIAGNOSIS — Z36 Encounter for antenatal screening of mother: Secondary | ICD-10-CM

## 2014-11-05 DIAGNOSIS — Z331 Pregnant state, incidental: Secondary | ICD-10-CM

## 2014-11-05 DIAGNOSIS — Z3689 Encounter for other specified antenatal screening: Secondary | ICD-10-CM

## 2014-11-05 DIAGNOSIS — O09292 Supervision of pregnancy with other poor reproductive or obstetric history, second trimester: Secondary | ICD-10-CM | POA: Diagnosis not present

## 2014-11-05 DIAGNOSIS — Z3482 Encounter for supervision of other normal pregnancy, second trimester: Secondary | ICD-10-CM

## 2014-11-05 LAB — POCT URINALYSIS DIPSTICK
Blood, UA: NEGATIVE
Glucose, UA: NEGATIVE
Ketones, UA: NEGATIVE
LEUKOCYTES UA: NEGATIVE
NITRITE UA: NEGATIVE
Protein, UA: NEGATIVE

## 2014-11-05 NOTE — Progress Notes (Signed)
U/S(19+4wks)-vtx active fetus, meas c/w dates, fluid wnl, anterior Gr 0 placenta, cx appears closed (3.1cm), bilateral adnexa appears WNL, FHR-148 bpm, female fetus, unable to view fetal face, profile,NB, cardiac OFT's due to fetal position, would like to reck anatomy at ~26-28 weeks

## 2014-11-05 NOTE — Progress Notes (Signed)
G2P0010 [redacted]w[redacted]d Estimated Date of Delivery: 03/28/15  Last menstrual period 06/15/2014.   BP weight and urine results all reviewed and noted.  Please refer to the obstetrical flow sheet for the fundal height and fetal heart rate documentation: Had anatomy scan today:  U/S(19+4wks)-vtx active fetus, meas c/w dates, fluid wnl, anterior Gr 0 placenta, cx appears closed (3.1cm), bilateral adnexa appears WNL, FHR-148 bpm, female fetus, unable to view fetal face, profile,NB, cardiac OFT's due to fetal position, would like to reck anatomy at ~26-28 weeks  Patient denies any bleeding and no rupture of membranes symptoms or regular contractions. Patient c/o RUQ pain for a few weeks, some nausea.  Went to [facility], was told to come back for an [place], but did not (had to work) All questions were answered.  Plan:  Continued routine obstetrical care, GB US ordered.  Follow up in 4 weeks for OB appointment,

## 2014-11-16 ENCOUNTER — Telehealth: Payer: Self-pay | Admitting: Women's Health

## 2014-11-16 NOTE — Telephone Encounter (Signed)
Pt informed that Drenda Freeze is out of the office today but when she returns tomorrow I will ask her about the u/s and see if she meant to cancel the order or not.  Pt verbalized understanding.

## 2014-11-16 NOTE — Telephone Encounter (Signed)
Morgan Conrad, this pt is calling stating that she has never heard back about the gb US.  I see where on her last visit you ordered the test but on the referral it states that it was canceled but I cant tell who it was canceled by.  Please advise.

## 2014-11-23 ENCOUNTER — Ambulatory Visit (HOSPITAL_COMMUNITY)
Admission: RE | Admit: 2014-11-23 | Discharge: 2014-11-23 | Disposition: A | Discharge status: Home / Self Care | Payer: Medicaid Other | Source: Ambulatory Visit | Attending: Advanced Practice Midwife | Admitting: Advanced Practice Midwife

## 2014-11-23 DIAGNOSIS — R1011 Right upper quadrant pain: Secondary | ICD-10-CM | POA: Insufficient documentation

## 2014-11-23 DIAGNOSIS — R934 Abnormal findings on diagnostic imaging of urinary organs: Secondary | ICD-10-CM | POA: Insufficient documentation

## 2014-11-24 ENCOUNTER — Other Ambulatory Visit: Payer: Self-pay | Admitting: Advanced Practice Midwife

## 2014-11-24 DIAGNOSIS — R93429 Abnormal radiologic findings on diagnostic imaging of unspecified kidney: Secondary | ICD-10-CM

## 2014-11-24 NOTE — Progress Notes (Signed)
GB US: normal gallbladder, but ? 2 cysts seen on right kidney, recommended renal US.  Scheduled and pt informed

## 2014-11-25 ENCOUNTER — Ambulatory Visit (HOSPITAL_COMMUNITY)
Admission: RE | Admit: 2014-11-25 | Discharge: 2014-11-25 | Disposition: A | Discharge status: Home / Self Care | Payer: Medicaid Other | Source: Ambulatory Visit | Attending: Advanced Practice Midwife | Admitting: Advanced Practice Midwife

## 2014-11-25 DIAGNOSIS — R93429 Abnormal radiologic findings on diagnostic imaging of unspecified kidney: Secondary | ICD-10-CM

## 2014-11-25 DIAGNOSIS — R109 Unspecified abdominal pain: Secondary | ICD-10-CM | POA: Insufficient documentation

## 2014-11-25 DIAGNOSIS — Z3A23 23 weeks gestation of pregnancy: Secondary | ICD-10-CM | POA: Insufficient documentation

## 2014-11-25 DIAGNOSIS — O26892 Other specified pregnancy related conditions, second trimester: Secondary | ICD-10-CM | POA: Insufficient documentation

## 2014-11-25 DIAGNOSIS — N1330 Unspecified hydronephrosis: Secondary | ICD-10-CM | POA: Insufficient documentation

## 2014-12-02 ENCOUNTER — Encounter: Payer: Self-pay | Admitting: Advanced Practice Midwife

## 2014-12-02 ENCOUNTER — Ambulatory Visit (INDEPENDENT_AMBULATORY_CARE_PROVIDER_SITE_OTHER): Payer: Medicaid Other | Admitting: Advanced Practice Midwife

## 2014-12-02 VITALS — BP 128/80 | Wt 169.0 lb

## 2014-12-02 DIAGNOSIS — Z331 Pregnant state, incidental: Secondary | ICD-10-CM

## 2014-12-02 DIAGNOSIS — Z3492 Encounter for supervision of normal pregnancy, unspecified, second trimester: Secondary | ICD-10-CM

## 2014-12-02 DIAGNOSIS — Z1389 Encounter for screening for other disorder: Secondary | ICD-10-CM

## 2014-12-02 DIAGNOSIS — R300 Dysuria: Secondary | ICD-10-CM

## 2014-12-02 LAB — POCT URINALYSIS DIPSTICK
Blood, UA: 3
GLUCOSE UA: NEGATIVE
KETONES UA: NEGATIVE
Leukocytes, UA: NEGATIVE
Nitrite, UA: NEGATIVE

## 2014-12-02 MED ORDER — NITROFURANTOIN MONOHYD MACRO 100 MG PO CAPS: 100.0000 mg | ORAL_CAPSULE | Freq: Two times a day (BID) | ORAL | Status: AC

## 2014-12-02 NOTE — Addendum Note (Signed)
Addended by: Criss Alvine on: 12/02/2014 11:08 AM   Modules accepted: Orders

## 2014-12-02 NOTE — Progress Notes (Signed)
G2P0010 [redacted]w[redacted]d Estimated Date of Delivery: 03/28/15  Weight 169 lb (76.658 kg), last menstrual period 06/15/2014.   BP weight and urine results all reviewed and noted.  Please refer to the obstetrical flow sheet for the fundal height and fetal heart rate documentation:  Patient reports good fetal movement, denies any bleeding and no rupture of membranes symptoms or regular contractions. Patient is without c/p dysuria. All questions were answered.  Plan:  Continued routine obstetrical care, Macrobid 100mg  BID X 7  Follow up in 4 weeks for OB appointment, PN2, to recehck anatomy

## 2014-12-02 NOTE — Patient Instructions (Signed)

## 2014-12-03 LAB — URINE CULTURE
Colony Count: NO GROWTH
Organism ID, Bacteria: NO GROWTH

## 2014-12-08 ENCOUNTER — Observation Stay (HOSPITAL_COMMUNITY)
Admission: AD | Admit: 2014-12-08 | Discharge: 2014-12-10 | Disposition: A | Discharge status: Home / Self Care | Payer: Medicaid Other | Source: Ambulatory Visit | Attending: Family Medicine | Admitting: Family Medicine

## 2014-12-08 ENCOUNTER — Encounter (HOSPITAL_COMMUNITY): Payer: Self-pay | Admitting: *Deleted

## 2014-12-08 ENCOUNTER — Inpatient Hospital Stay (HOSPITAL_COMMUNITY): Payer: Medicaid Other

## 2014-12-08 DIAGNOSIS — N939 Abnormal uterine and vaginal bleeding, unspecified: Secondary | ICD-10-CM | POA: Insufficient documentation

## 2014-12-08 DIAGNOSIS — Z87891 Personal history of nicotine dependence: Secondary | ICD-10-CM | POA: Insufficient documentation

## 2014-12-08 DIAGNOSIS — O3432 Maternal care for cervical incompetence, second trimester: Secondary | ICD-10-CM

## 2014-12-08 DIAGNOSIS — O9989 Other specified diseases and conditions complicating pregnancy, childbirth and the puerperium: Secondary | ICD-10-CM | POA: Diagnosis present

## 2014-12-08 DIAGNOSIS — Z3A24 24 weeks gestation of pregnancy: Secondary | ICD-10-CM | POA: Diagnosis not present

## 2014-12-08 DIAGNOSIS — O26872 Cervical shortening, second trimester: Secondary | ICD-10-CM | POA: Diagnosis not present

## 2014-12-08 DIAGNOSIS — O26879 Cervical shortening, unspecified trimester: Secondary | ICD-10-CM

## 2014-12-08 DIAGNOSIS — O468X2 Other antepartum hemorrhage, second trimester: Secondary | ICD-10-CM | POA: Insufficient documentation

## 2014-12-08 DIAGNOSIS — R110 Nausea: Secondary | ICD-10-CM | POA: Diagnosis not present

## 2014-12-08 DIAGNOSIS — O3433 Maternal care for cervical incompetence, third trimester: Secondary | ICD-10-CM

## 2014-12-08 DIAGNOSIS — O4690 Antepartum hemorrhage, unspecified, unspecified trimester: Secondary | ICD-10-CM | POA: Diagnosis present

## 2014-12-08 LAB — URINALYSIS, ROUTINE W REFLEX MICROSCOPIC
Bilirubin Urine: NEGATIVE
Glucose, UA: NEGATIVE mg/dL
Ketones, ur: NEGATIVE mg/dL
Leukocytes, UA: NEGATIVE
Nitrite: NEGATIVE
Protein, ur: NEGATIVE mg/dL
Specific Gravity, Urine: 1.02 (ref 1.005–1.030)
Urobilinogen, UA: 0.2 mg/dL (ref 0.0–1.0)
pH: 6 (ref 5.0–8.0)

## 2014-12-08 LAB — URINE MICROSCOPIC-ADD ON

## 2014-12-08 NOTE — MAU Note (Signed)
Pt reports lower abd pain this pm, spotting x one. Denies dysuria.

## 2014-12-08 NOTE — MAU Note (Signed)
Pt. States that tonight after urinating noticed bright red blood on the tissue after wiping. Also, went to the restroom an hour and a half later and noticed a clot that was darker in color in the toilet. Left sided lower abdominal pain that comes goes has been occuring since bleeding episode. Last intercourse was 08/03/14. Last OB appointment was last week. Next one is scheduled Feb 3.

## 2014-12-09 ENCOUNTER — Encounter (HOSPITAL_COMMUNITY): Payer: Self-pay | Admitting: *Deleted

## 2014-12-09 DIAGNOSIS — R110 Nausea: Secondary | ICD-10-CM

## 2014-12-09 DIAGNOSIS — O26879 Cervical shortening, unspecified trimester: Secondary | ICD-10-CM | POA: Insufficient documentation

## 2014-12-09 DIAGNOSIS — O26872 Cervical shortening, second trimester: Secondary | ICD-10-CM | POA: Diagnosis not present

## 2014-12-09 DIAGNOSIS — N939 Abnormal uterine and vaginal bleeding, unspecified: Secondary | ICD-10-CM | POA: Insufficient documentation

## 2014-12-09 DIAGNOSIS — Z3A24 24 weeks gestation of pregnancy: Secondary | ICD-10-CM

## 2014-12-09 DIAGNOSIS — O468X2 Other antepartum hemorrhage, second trimester: Secondary | ICD-10-CM

## 2014-12-09 LAB — CBC
HCT: 32.4 % — ABNORMAL LOW (ref 36.0–46.0)
HEMOGLOBIN: 10.9 g/dL — AB (ref 12.0–15.0)
MCH: 30.1 pg (ref 26.0–34.0)
MCHC: 33.6 g/dL (ref 30.0–36.0)
MCV: 89.5 fL (ref 78.0–100.0)
Platelets: 282 10*3/uL (ref 150–400)
RBC: 3.62 MIL/uL — ABNORMAL LOW (ref 3.87–5.11)
RDW: 13.3 % (ref 11.5–15.5)
WBC: 10.5 10*3/uL (ref 4.0–10.5)

## 2014-12-09 MED ORDER — ZOLPIDEM TARTRATE 5 MG PO TABS: 5.0000 mg | ORAL_TABLET | Freq: Every evening | ORAL | Status: DC | PRN

## 2014-12-09 MED ORDER — BETAMETHASONE SOD PHOS & ACET 6 (3-3) MG/ML IJ SUSP
12.0000 mg | INTRAMUSCULAR | Status: DC
  Administered 2014-12-10: 12 mg via INTRAMUSCULAR
  Filled 2014-12-09 (×2): qty 2

## 2014-12-09 MED ORDER — PRENATAL MULTIVITAMIN CH
1.0000 | ORAL_TABLET | Freq: Every day | ORAL | Status: DC
  Administered 2014-12-09 – 2014-12-10 (×2): 1 via ORAL
  Filled 2014-12-09 (×3): qty 1

## 2014-12-09 MED ORDER — PROGESTERONE MICRONIZED 200 MG PO CAPS: 200.0000 mg | ORAL_CAPSULE | Freq: Every day | ORAL | Status: DC

## 2014-12-09 MED ORDER — CALCIUM CARBONATE ANTACID 500 MG PO CHEW
2.0000 | CHEWABLE_TABLET | ORAL | Status: DC | PRN
  Filled 2014-12-09: qty 2

## 2014-12-09 MED ORDER — ACETAMINOPHEN 325 MG PO TABS: 650.0000 mg | ORAL_TABLET | ORAL | Status: DC | PRN

## 2014-12-09 MED ORDER — NITROFURANTOIN MONOHYD MACRO 100 MG PO CAPS
100.0000 mg | ORAL_CAPSULE | Freq: Two times a day (BID) | ORAL | Status: DC
  Administered 2014-12-09 – 2014-12-10 (×3): 100 mg via ORAL
  Filled 2014-12-09 (×6): qty 1

## 2014-12-09 MED ORDER — PROGESTERONE MICRONIZED 200 MG PO CAPS
200.0000 mg | ORAL_CAPSULE | Freq: Every day | ORAL | Status: DC
  Administered 2014-12-09: 200 mg via ORAL
  Filled 2014-12-09: qty 1

## 2014-12-09 MED ORDER — PROGESTERONE MICRONIZED 200 MG PO CAPS
200.0000 mg | ORAL_CAPSULE | Freq: Every day | ORAL | Status: DC
  Administered 2014-12-09: 200 mg via VAGINAL
  Filled 2014-12-09: qty 1

## 2014-12-09 MED ORDER — DOCUSATE SODIUM 100 MG PO CAPS
100.0000 mg | ORAL_CAPSULE | Freq: Every day | ORAL | Status: DC
  Administered 2014-12-09 – 2014-12-10 (×2): 100 mg via ORAL
  Filled 2014-12-09 (×3): qty 1

## 2014-12-09 NOTE — H&P (Signed)
Morgan Conrad is a 24 y.o. G2P0010 at [redacted]w[redacted]d who presents to maternity admissions reporting vaginal bleeding.  She noted red blood on tissue paper when wiping in restroom about 2 hours ago followed by mild LLQ pain radiating into midline pelvis that comes and goes. Later passed dime-sized clot in restroom causing her to come to the MAU. No bleeding since that time. Denies intercourse, cervical checks or any trauma. Was walking on treadmill as usual earlier this afternoon and noted no discomfort at that time.   Denies contractions, leakage of fluid, dysuria. Good fetal movement.   Maternal Medical History:  Reason for admission: Nausea.    OB History    Gravida Para Term Preterm AB TAB SAB Ectopic Multiple Living   2    1 1          Past Medical History  Diagnosis Date  . Encounter for Nexplanon removal 10/21/2013  . History of termination of pregnancy 01/09/2014  . Medical history non-contributory    Past Surgical History  Procedure Laterality Date  . Knee surgery Right    Family History: family history includes Diabetes in her maternal grandfather; Hypertension in her maternal grandfather. Social History:  reports that she has quit smoking. She has never used smokeless tobacco. She reports that she does not drink alcohol or use illicit drugs.  Review of Systems  Constitutional: Negative for fever and chills.  Respiratory: Negative for cough and shortness of breath.   Cardiovascular: Negative for chest pain and leg swelling.  Gastrointestinal: Negative for heartburn, nausea, vomiting and diarrhea.  Genitourinary: Negative for dysuria, urgency, frequency and hematuria.  Neurological:       No headache   In addition to HPI: Constitutional: Negative for fever and chills Respiratory: Negative for dyspnea Cardiovascular: Negative for chest pain or palpitations  Gastrointestinal: Negative for vomiting, diarrhea and constipation Genitourinary: Negative for dysuria and  urgency Neurological: Negative for headaches and visual changes  Blood pressure 123/73, pulse 83, temperature 97.8 F (36.6 C), temperature source Oral, resp. rate 20, last menstrual period 06/15/2014, SpO2 99 %. Maternal Exam:  Abdomen: Patient reports no abdominal tenderness. Introitus: Normal vulva. Normal vagina.  Ferning test: not done.  Nitrazine test: not done. Amniotic fluid character: not assessed.  Pelvis: adequate for delivery.   Cervix: Cervix evaluated by sterile speculum exam and digital exam.     Physical Exam  Constitutional: She is oriented to person, place, and time. She appears well-developed and well-nourished.  HENT:  Head: Normocephalic and atraumatic.  Eyes: Conjunctivae and EOM are normal.  Neck: Normal range of motion.  Cardiovascular: Normal rate.   Respiratory: Effort normal. No respiratory distress.  GI: Soft. She exhibits no distension. There is no tenderness.  Genitourinary:  No blood in vault, small amount of white regular vaginal discharge  Musculoskeletal: Normal range of motion. She exhibits no edema.  Neurological: She is alert and oriented to person, place, and time.  Skin: Skin is warm and dry. No erythema.    GENERAL: Well-appearing, well-nourished female in no distress.  HEENT: Normocephalic, atraumatic HEART: Normal rate RESP: Normal effort ABDOMEN: Soft, non-tender, gravid uterus consistent with dates. EXTREMITIES: No edema NEURO: alert and oriented, DTRs 2+  SPECULUM EXAM: NEFG, physiologic thin white discharge, no blood noted  Dilation: Fingertip Effacement (%): Thick Exam by:: Dr. [email or web address]  FHT: Baseline 145 , moderate variability, accelerations present, rare variable decelerations Toco: None   Prenatal labs: ABO, Rh: O/POS/-- (09/16 1134) Antibody: NEG (09/16 1134) Rubella: 1.13 (09/16 1134) RPR:  NON REAC (09/16 1134)  HBsAg: NEGATIVE (09/16 1134)  HIV: NONREACTIVE (09/16 1134)  GBS:     Assessment/Plan: Morgan Conrad  is a 24 y.o. G2P0010 at [redacted]w[redacted]d with vaginal bleeding and cervical shortening concerning for preterm labor.   - Admit to antepartum for observation - Prometrium - Betamethasone   - Continue macrobid for UTI   [name] 12/09/2014, 12:18 AM   OB fellow attestation:  I have seen and examined this patient; I agree with above documentation in the resident's note.   Morgan Conrad is a 24 y.o. G2P0010 here after passing small blood clot while walking treadmill, she normally exercises much more than this.  No pain, no contractions, no longer bleeding  PE: BP 103/78 mmHg  Pulse 77  Temp(Src) 98.9 F (37.2 C) (Oral)  Resp 18  Ht 5' 2.5" (1.588 m)  Wt 171 lb (77.565 kg)  BMI 30.76 kg/m2  SpO2 99%  LMP 06/15/2014 Gen: calm comfortable, NAD Resp: normal effort, no distress Abd: gravid  ROS, labs, PMH reviewed  Plan: CL showed dynamic change now 1.5cm with funneling via transvaginal sono, previously 3.1cm at [redacted]w[redacted]d. - betamethasone, first dose given at 0030 - macrobid for UTI (continue tx) - start prometrium - continuous toco, NST q shift  Morgan Conrad 12/09/2014, 9:11 AM

## 2014-12-09 NOTE — Progress Notes (Signed)
Ur chart review completed.  

## 2014-12-09 NOTE — Plan of Care (Signed)
Problem: Consults Goal: Birthing Suites Patient Information Press F2 to bring up selections list Outcome: Completed/Met Date Met:  12/09/14  Pt < [redacted] weeks EGA     

## 2014-12-10 ENCOUNTER — Inpatient Hospital Stay (HOSPITAL_COMMUNITY): Payer: Medicaid Other

## 2014-12-10 DIAGNOSIS — O4690 Antepartum hemorrhage, unspecified, unspecified trimester: Secondary | ICD-10-CM | POA: Diagnosis present

## 2014-12-10 DIAGNOSIS — O26872 Cervical shortening, second trimester: Secondary | ICD-10-CM | POA: Diagnosis not present

## 2014-12-10 MED ORDER — PROGESTERONE MICRONIZED 200 MG PO CAPS: 200.0000 mg | ORAL_CAPSULE | Freq: Every day | ORAL | Status: DC

## 2014-12-10 NOTE — Discharge Summary (Signed)
Physician Discharge Summary  Patient ID: Morgan Conrad MRN: [id number] DOB/AGE: 08-14-1991 23 y.o.  Admit date: 12/08/2014 Discharge date: 12/10/2014  Admission Diagnoses:  Discharge Diagnoses:  Active Problems:   [redacted] weeks gestation of pregnancy   Cervical shortening affecting pregnancy   Vaginal bleeding   Discharged Condition: good  Hospital Course: presented to MAU after passing small dime sized clot while walking on treadmill.  Cervical length showed change from 3.1cm @ 19.4 to 1.5cm with funneling at time of admit, repeat prior to discharge 1.76 with funneling.  Repeat done 2/2 couple of contractions patient reported overnight.  Needs repeat sono in 1 week.  Received BMZ 1/13-14, prometrium started.  Advised no prolonged standing, no exercise.  Consults: None  Significant Diagnostic Studies:    Treatments: IV hydration, prometrium, BMZ, macrobid continued for UTI,   Discharge Exam: Blood pressure 116/67, pulse 91, temperature 98.4 F (36.9 C), temperature source Oral, resp. rate 18, height 5' 2.5" (1.588 m), weight 171 lb (77.565 kg), last menstrual period 06/15/2014, SpO2 99 %. General appearance: alert, cooperative and appears stated age Resp: no respiratory distress Cardio: regular rate GI: soft, non-tender; bowel sounds normal; no masses,  no organomegaly Extremities: extremities normal, atraumatic, no cyanosis or edema Skin: Skin color, texture, turgor normal. No rashes or lesions  Disposition: Final discharge disposition not confirmed     Medication List    ASK your doctor about these medications        nitrofurantoin (macrocrystal-monohydrate) 100 MG capsule  Commonly known as:  MACROBID  Take 1 capsule (100 mg total) by mouth 2 (two) times daily.     prenatal multivitamin Tabs tablet  Take 1 tablet by mouth daily at 12 noon.           Follow-up Information    Follow up with [facility] On 12/11/2014.   Contact information:   [address] [place] [address] [phone]      Signed: [name] 12/10/2014, 10:07 AM

## 2014-12-10 NOTE — Discharge Instructions (Signed)
Cervical Insufficiency Cervical insufficiency is when the cervix is weak and starts to open (dilate) and thin (efface) before the pregnancy is at term and without labor starting. This is also called incompetent cervix. It can happen in the second or third trimester when the fetus starts putting pressure on the cervix. Cervical insufficiency can lead to a miscarriage, preterm premature rupture of the membranes (PPROM), or having the baby early (preterm birth).  RISK FACTORS You may be more likely to develop cervical insufficiency if:  You have a shorter cervix than normal.  Damage or injury occurred to your cervix from a past pregnancy or surgery.  You were born with a cervical defect.  You have had a procedure done on the cervix, such as cervical biopsy.  You have a history of cervical insufficiency.  You have a history of PPROM.  You have ended several past pregnancies through abortion.  You were exposed to the drug diethylstilbestrol (DES). SYMPTOMS Often times, women do not have any symptoms. Other times, women may only have mild symptoms that often start between week 14 through 20. The symptoms may last several days or weeks. These symptoms include: 1. Light spotting or bleeding from the vagina. 2. Pelvic pressure. 3. A change in vaginal discharge, such as discharge that changes from clear, white, or light yellow to pink or tan. 4. Back pain. 5. Abdominal pain or cramping. DIAGNOSIS Cervical insufficiency cannot be diagnosed before you become pregnant. Once you are pregnant, your health care provider will ask about your medical history and if you have had any problems in past pregnancies. Tell your health care provider about any procedures performed on your cervix or if you have a history of miscarriages or cervical insufficiency. If your health care provider thinks you are at high risk for cervical insufficiency or show signs of cervical insufficiency, he or she may:  Perform a  pelvic exam. This will check for:  The presence of the membranes (amniotic sac) coming out of the cervix.  Cervical abnormalities.  Cervical injuries.  The presence of contractions.  Perform an ultrasonography (commonly called ultrasound) to measure the length and thickness of the cervix. TREATMENT If you have been diagnosed with cervical insufficiency, your health care provider may recommend:  Limiting physical activity.  Bed rest at home or in the hospital.  Pelvic rest, which means no sexual intercourse or placing anything in the vagina.  Cerclage to sew the cervix closed and prevent it from opening too early. The stitches (sutures) are removed between weeks 36 and 38 to avoid problems during labor. Cerclage may be recommended during pregnancy if you have had a history of miscarriages or preterm births without a known cause. It may also be recommended if you have a short cervix that was identified by ultrasound or if your health care provider has found that your cervix has dilated before 24 weeks of pregnancy. Limiting physical activity and bed rest may or may not help prevent a preterm birth. WHEN SHOULD YOU SEEK IMMEDIATE MEDICAL CARE?  Seek immediate medical care if you show any symptoms of cervical insufficiency. You will need to go to the hospital to get checked immediately. Document Released: 11/13/2005 Document Revised: 03/30/2014 Document Reviewed: 01/20/2013 [facility] Patient Information 2015 [place], [place]. This information is not intended to replace advice given to you by your health care provider. Make sure you discuss any questions you have with your health care provider.   Fetal Movement Counts Patient Name: __________________________________________________ Patient Due Date: ____________________ Performing  a fetal movement count is highly recommended in high-risk pregnancies, but it is good for every pregnant woman to do. Your health care provider may ask you to start  counting fetal movements at 28 weeks of the pregnancy. Fetal movements often increase:  After eating a full meal.  After physical activity.  After eating or drinking something sweet or cold.  At rest. Pay attention to when you feel the baby is most active. This will help you notice a pattern of your baby's sleep and wake cycles and what factors contribute to an increase in fetal movement. It is important to perform a fetal movement count at the same time each day when your baby is normally most active.  HOW TO COUNT FETAL MOVEMENTS 6. Find a quiet and comfortable area to sit or lie down on your left side. Lying on your left side provides the best blood and oxygen circulation to your baby. 7. Write down the day and time on a sheet of paper or in a journal. 8. Start counting kicks, flutters, swishes, rolls, or jabs in a 2-hour period. You should feel at least 10 movements within 2 hours. 9. If you do not feel 10 movements in 2 hours, wait 2-3 hours and count again. Look for a change in the pattern or not enough counts in 2 hours. SEEK MEDICAL CARE IF:  You feel less than 10 counts in 2 hours, tried twice.  There is no movement in over an hour.  The pattern is changing or taking longer each day to reach 10 counts in 2 hours.  You feel the baby is not moving as he or she usually does. Date: ____________ Movements: ____________ Start time: ____________ Doreatha Martin time: ____________  Date: ____________ Movements: ____________ Start time: ____________ Doreatha Martin time: ____________ Date: ____________ Movements: ____________ Start time: ____________ Doreatha Martin time: ____________ Date: ____________ Movements: ____________ Start time: ____________ Doreatha Martin time: ____________ Date: ____________ Movements: ____________ Start time: ____________ Doreatha Martin time: ____________ Date: ____________ Movements: ____________ Start time: ____________ Doreatha Martin time: ____________ Date: ____________ Movements: ____________ Start  time: ____________ Doreatha Martin time: ____________ Date: ____________ Movements: ____________ Start time: ____________ Doreatha Martin time: ____________  Date: ____________ Movements: ____________ Start time: ____________ Doreatha Martin time: ____________ Date: ____________ Movements: ____________ Start time: ____________ Doreatha Martin time: ____________ Date: ____________ Movements: ____________ Start time: ____________ Doreatha Martin time: ____________ Date: ____________ Movements: ____________ Start time: ____________ Doreatha Martin time: ____________ Date: ____________ Movements: ____________ Start time: ____________ Doreatha Martin time: ____________ Date: ____________ Movements: ____________ Start time: ____________ Doreatha Martin time: ____________ Date: ____________ Movements: ____________ Start time: ____________ Doreatha Martin time: ____________  Date: ____________ Movements: ____________ Start time: ____________ Doreatha Martin time: ____________ Date: ____________ Movements: ____________ Start time: ____________ Doreatha Martin time: ____________ Date: ____________ Movements: ____________ Start time: ____________ Doreatha Martin time: ____________ Date: ____________ Movements: ____________ Start time: ____________ Doreatha Martin time: ____________ Date: ____________ Movements: ____________ Start time: ____________ Doreatha Martin time: ____________ Date: ____________ Movements: ____________ Start time: ____________ Doreatha Martin time: ____________ Date: ____________ Movements: ____________ Start time: ____________ Doreatha Martin time: ____________  Date: ____________ Movements: ____________ Start time: ____________ Doreatha Martin time: ____________ Date: ____________ Movements: ____________ Start time: ____________ Doreatha Martin time: ____________ Date: ____________ Movements: ____________ Start time: ____________ Doreatha Martin time: ____________ Date: ____________ Movements: ____________ Start time: ____________ Doreatha Martin time: ____________ Date: ____________ Movements: ____________ Start time: ____________ Doreatha Martin time:  ____________ Date: ____________ Movements: ____________ Start time: ____________ Doreatha Martin time: ____________ Date: ____________ Movements: ____________ Start time: ____________ Doreatha Martin time: ____________  Date: ____________ Movements: ____________ Start time: ____________ Doreatha Martin time: ____________ Date: ____________ Movements: ____________ Start time: ____________ Doreatha Martin  time: ____________ Date: ____________ Movements: ____________ Start time: ____________ Doreatha Martin time: ____________ Date: ____________ Movements: ____________ Start time: ____________ Doreatha Martin time: ____________ Date: ____________ Movements: ____________ Start time: ____________ Doreatha Martin time: ____________ Date: ____________ Movements: ____________ Start time: ____________ Doreatha Martin time: ____________ Date: ____________ Movements: ____________ Start time: ____________ Doreatha Martin time: ____________  Date: ____________ Movements: ____________ Start time: ____________ Doreatha Martin time: ____________ Date: ____________ Movements: ____________ Start time: ____________ Doreatha Martin time: ____________ Date: ____________ Movements: ____________ Start time: ____________ Doreatha Martin time: ____________ Date: ____________ Movements: ____________ Start time: ____________ Doreatha Martin time: ____________ Date: ____________ Movements: ____________ Start time: ____________ Doreatha Martin time: ____________ Date: ____________ Movements: ____________ Start time: ____________ Doreatha Martin time: ____________ Date: ____________ Movements: ____________ Start time: ____________ Doreatha Martin time: ____________  Date: ____________ Movements: ____________ Start time: ____________ Doreatha Martin time: ____________ Date: ____________ Movements: ____________ Start time: ____________ Doreatha Martin time: ____________ Date: ____________ Movements: ____________ Start time: ____________ Doreatha Martin time: ____________ Date: ____________ Movements: ____________ Start time: ____________ Doreatha Martin time: ____________ Date: ____________ Movements:  ____________ Start time: ____________ Doreatha Martin time: ____________ Date: ____________ Movements: ____________ Start time: ____________ Doreatha Martin time: ____________ Date: ____________ Movements: ____________ Start time: ____________ Doreatha Martin time: ____________  Date: ____________ Movements: ____________ Start time: ____________ Doreatha Martin time: ____________ Date: ____________ Movements: ____________ Start time: ____________ Doreatha Martin time: ____________ Date: ____________ Movements: ____________ Start time: ____________ Doreatha Martin time: ____________ Date: ____________ Movements: ____________ Start time: ____________ Doreatha Martin time: ____________ Date: ____________ Movements: ____________ Start time: ____________ Doreatha Martin time: ____________ Date: ____________ Movements: ____________ Start time: ____________ Doreatha Martin time: ____________ Document Released: 12/13/2006 Document Revised: 03/30/2014 Document Reviewed: 09/09/2012 [employer] Patient Information 2015 [employer]. This information is not intended to replace advice given to you by your health care provider. Make sure you discuss any questions you have with your health care provider.

## 2014-12-11 ENCOUNTER — Encounter: Payer: Medicaid Other | Admitting: Obstetrics and Gynecology

## 2014-12-15 ENCOUNTER — Encounter: Payer: Self-pay | Admitting: Obstetrics and Gynecology

## 2014-12-15 ENCOUNTER — Ambulatory Visit (INDEPENDENT_AMBULATORY_CARE_PROVIDER_SITE_OTHER): Payer: Medicaid Other | Admitting: Obstetrics and Gynecology

## 2014-12-15 DIAGNOSIS — O26872 Cervical shortening, second trimester: Secondary | ICD-10-CM | POA: Diagnosis not present

## 2014-12-15 DIAGNOSIS — Z1389 Encounter for screening for other disorder: Secondary | ICD-10-CM | POA: Diagnosis not present

## 2014-12-15 DIAGNOSIS — Z331 Pregnant state, incidental: Secondary | ICD-10-CM | POA: Diagnosis not present

## 2014-12-15 DIAGNOSIS — O09892 Supervision of other high risk pregnancies, second trimester: Secondary | ICD-10-CM

## 2014-12-15 LAB — POCT URINALYSIS DIPSTICK
Blood, UA: NEGATIVE
Glucose, UA: NEGATIVE
Ketones, UA: NEGATIVE
Leukocytes, UA: NEGATIVE
Nitrite, UA: NEGATIVE

## 2014-12-15 MED ORDER — FLUCONAZOLE 150 MG PO TABS: 150.0000 mg | ORAL_TABLET | Freq: Once | ORAL | Status: DC

## 2014-12-15 NOTE — Progress Notes (Signed)
G2P0010 [redacted]w[redacted]d Estimated Date of Delivery: 03/28/15  Blood pressure 122/76, weight 173 lb (78.472 kg), last menstrual period 06/15/2014.  Hospitalized last wk for short cervix  refer to the ob flow sheet for FH and FHR, also BP, Wt, Urine results:notable for negative.  Patient reports   good fetal movement, denies any bleeding and no rupture of membranes symptoms or regular contractions. Patient complaints:none, .had a few contractions yesterday none today. 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  Questions were answered. 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  F/u in 2  weeks for hrob

## 2014-12-15 NOTE — Addendum Note (Signed)
Addended by: Tilda Burrow on: 12/15/2014 02:20 PM   Modules accepted: Orders, Level of Service

## 2014-12-15 NOTE — Progress Notes (Signed)
Pt denies any problems or concerns at this time.  

## 2014-12-28 ENCOUNTER — Other Ambulatory Visit: Payer: Self-pay | Admitting: Obstetrics and Gynecology

## 2014-12-28 DIAGNOSIS — O26879 Cervical shortening, unspecified trimester: Secondary | ICD-10-CM

## 2014-12-30 ENCOUNTER — Other Ambulatory Visit: Payer: Self-pay | Admitting: Obstetrics and Gynecology

## 2014-12-30 ENCOUNTER — Other Ambulatory Visit: Payer: Self-pay | Admitting: Advanced Practice Midwife

## 2014-12-30 ENCOUNTER — Encounter (HOSPITAL_COMMUNITY): Payer: Self-pay

## 2014-12-30 ENCOUNTER — Ambulatory Visit (HOSPITAL_COMMUNITY)
Admission: RE | Admit: 2014-12-30 | Discharge: 2014-12-30 | Disposition: A | Discharge status: Home / Self Care | Payer: Medicaid Other | Source: Ambulatory Visit | Attending: Obstetrics & Gynecology | Admitting: Obstetrics & Gynecology

## 2014-12-30 ENCOUNTER — Encounter: Payer: Self-pay | Admitting: Advanced Practice Midwife

## 2014-12-30 ENCOUNTER — Ambulatory Visit (INDEPENDENT_AMBULATORY_CARE_PROVIDER_SITE_OTHER): Payer: Medicaid Other | Admitting: Advanced Practice Midwife

## 2014-12-30 ENCOUNTER — Other Ambulatory Visit: Payer: Medicaid Other

## 2014-12-30 ENCOUNTER — Ambulatory Visit (INDEPENDENT_AMBULATORY_CARE_PROVIDER_SITE_OTHER)

## 2014-12-30 VITALS — BP 128/80 | Wt 178.0 lb

## 2014-12-30 DIAGNOSIS — O26879 Cervical shortening, unspecified trimester: Secondary | ICD-10-CM

## 2014-12-30 DIAGNOSIS — Z131 Encounter for screening for diabetes mellitus: Secondary | ICD-10-CM

## 2014-12-30 DIAGNOSIS — O26872 Cervical shortening, second trimester: Secondary | ICD-10-CM

## 2014-12-30 DIAGNOSIS — Z114 Encounter for screening for human immunodeficiency virus [HIV]: Secondary | ICD-10-CM

## 2014-12-30 DIAGNOSIS — O3432 Maternal care for cervical incompetence, second trimester: Secondary | ICD-10-CM | POA: Diagnosis not present

## 2014-12-30 DIAGNOSIS — Z3A27 27 weeks gestation of pregnancy: Secondary | ICD-10-CM | POA: Insufficient documentation

## 2014-12-30 DIAGNOSIS — O3442 Maternal care for other abnormalities of cervix, second trimester: Secondary | ICD-10-CM

## 2014-12-30 DIAGNOSIS — Z3483 Encounter for supervision of other normal pregnancy, third trimester: Secondary | ICD-10-CM

## 2014-12-30 DIAGNOSIS — Z36 Encounter for antenatal screening of mother: Secondary | ICD-10-CM

## 2014-12-30 DIAGNOSIS — O0992 Supervision of high risk pregnancy, unspecified, second trimester: Secondary | ICD-10-CM

## 2014-12-30 DIAGNOSIS — Z3689 Encounter for other specified antenatal screening: Secondary | ICD-10-CM

## 2014-12-30 DIAGNOSIS — Z113 Encounter for screening for infections with a predominantly sexual mode of transmission: Secondary | ICD-10-CM

## 2014-12-30 DIAGNOSIS — Z331 Pregnant state, incidental: Secondary | ICD-10-CM

## 2014-12-30 DIAGNOSIS — Z0184 Encounter for antibody response examination: Secondary | ICD-10-CM

## 2014-12-30 DIAGNOSIS — Z1389 Encounter for screening for other disorder: Secondary | ICD-10-CM

## 2014-12-30 LAB — US OB TRANSVAGINAL

## 2014-12-30 NOTE — Progress Notes (Signed)
U/S(27+3wks)- vtx active fetus, EFw 2 lb 5 oz (48th%tile), fluid WNL, anterior Gr 0 placenta, FHR-147 bpm, fetal face/profile/cardiac OFT's noted on today's exam, Vaginal u/s performed to check cx length, FUNNELING noted CX=0.3cm funnel=3.6cm in length and 2.7cm in width

## 2014-12-30 NOTE — Progress Notes (Signed)
High Risk Pregnancy Diagnosis(es):   Short cervix  G2P0010 [redacted]w[redacted]d Estimated Date of Delivery: 03/28/15  Blood pressure 128/80, weight 80.74 kg (178 lb), last menstrual period 06/15/2014.  Urinalysis: Negative HPI: Dx with short cervix a few weeks ago and given BMZ.  Had [place] today for cervical length:    U/S(27+3wks)- vtx active fetus, EFw 2 lb 5 oz (48th%tile), fluid WNL, anterior Gr 0 placenta, FHR-147 bpm, fetal face/profile/cardiac OFT's noted on today's exam, Vaginal u/s performed to check cx length, FUNNELING noted CX=0.3cm funnel=3.6cm in length and 2.7cm in width   BP weight and urine results all reviewed and noted. Patient reports good fetal movement, denies any bleeding and no rupture of membranes symptoms or regular contractions.  Has about 2 BH/nights.  Is limiting activity and resting All questions were answered.  Assessment:  [redacted]w[redacted]d,   Short cx, now with funneling.  Discussed with Dr Claudean Severance, MFM  Medication(s) Plans:  Continue prometrium Treatment Plan:  To MFM to be fitted for pessary  Follow up in 2 weeks

## 2014-12-31 LAB — CBC
HCT: 37.5 % (ref 34.0–46.6)
Hemoglobin: 12.8 g/dL (ref 11.1–15.9)
MCH: 30.3 pg (ref 26.6–33.0)
MCHC: 34.1 g/dL (ref 31.5–35.7)
MCV: 89 fL (ref 79–97)
Platelets: 318 10*3/uL (ref 150–379)
RBC: 4.22 x10E6/uL (ref 3.77–5.28)
RDW: 13.6 % (ref 12.3–15.4)
WBC: 10.6 10*3/uL (ref 3.4–10.8)

## 2014-12-31 LAB — RPR: RPR Ser Ql: NONREACTIVE

## 2014-12-31 LAB — ANTIBODY SCREEN: ANTIBODY SCREEN: NEGATIVE

## 2014-12-31 LAB — HSV 2 ANTIBODY, IGG

## 2014-12-31 LAB — HIV ANTIBODY (ROUTINE TESTING W REFLEX): HIV Screen 4th Generation wRfx: NONREACTIVE

## 2014-12-31 LAB — GLUCOSE TOLERANCE, 2 HOURS W/ 1HR
GLUCOSE, 1 HOUR: 133 mg/dL (ref 65–179)
Glucose, 2 hour: 131 mg/dL (ref 65–152)
Glucose, Fasting: 80 mg/dL (ref 65–91)

## 2015-01-05 ENCOUNTER — Other Ambulatory Visit: Payer: Self-pay | Admitting: Advanced Practice Midwife

## 2015-01-05 DIAGNOSIS — O26879 Cervical shortening, unspecified trimester: Secondary | ICD-10-CM

## 2015-01-07 ENCOUNTER — Ambulatory Visit (INDEPENDENT_AMBULATORY_CARE_PROVIDER_SITE_OTHER): Payer: Medicaid Other | Admitting: Advanced Practice Midwife

## 2015-01-07 ENCOUNTER — Ambulatory Visit (INDEPENDENT_AMBULATORY_CARE_PROVIDER_SITE_OTHER)

## 2015-01-07 VITALS — BP 136/90 | Wt 179.0 lb

## 2015-01-07 DIAGNOSIS — O26879 Cervical shortening, unspecified trimester: Secondary | ICD-10-CM

## 2015-01-07 DIAGNOSIS — O26873 Cervical shortening, third trimester: Secondary | ICD-10-CM

## 2015-01-07 DIAGNOSIS — Z3483 Encounter for supervision of other normal pregnancy, third trimester: Secondary | ICD-10-CM

## 2015-01-07 DIAGNOSIS — O09893 Supervision of other high risk pregnancies, third trimester: Secondary | ICD-10-CM

## 2015-01-07 DIAGNOSIS — Z1389 Encounter for screening for other disorder: Secondary | ICD-10-CM

## 2015-01-07 DIAGNOSIS — Z331 Pregnant state, incidental: Secondary | ICD-10-CM

## 2015-01-07 LAB — POCT URINALYSIS DIPSTICK
Glucose, UA: NEGATIVE
Glucose, UA: NEGATIVE
Ketones, UA: NEGATIVE
Nitrite, UA: NEGATIVE
Protein, UA: NEGATIVE
Protein, UA: NEGATIVE

## 2015-01-07 MED ORDER — NIFEDIPINE ER OSMOTIC RELEASE 30 MG PO TB24: 30.0000 mg | ORAL_TABLET | Freq: Every day | ORAL | Status: DC

## 2015-01-07 NOTE — Progress Notes (Signed)
TV US completed today (28+[redacted] weeks GA).  Single, active fetus in a cephalic presentation.  FHR is 141 bpm.  Cervix appears open with funneling. Internal os opening measures 1.2cm and external os measures 0.5cm and  Debris noted within cervix.  Pessary noted.

## 2015-01-07 NOTE — Progress Notes (Signed)
High Risk Pregnancy Diagnosis(es):   Short cervix  G2P0010 [redacted]w[redacted]d Estimated Date of Delivery: 03/28/15  Blood pressure 136/90, weight 179 lb (81.194 kg), last menstrual period 06/15/2014.  Urinalysis: Negative   HPI:  Pt went to MFM after her last visit here and a pessary was placed. She continues to have limited activity.  States that she has occasional contractions throughout the day, more, of course, at night.  Continues to use vaginal prometrium qhs   BP weight and urine results all reviewed and noted. Patient reports good fetal movement, denies any bleeding and no rupture of membranes symptoms  Fundal Height:  28 Fetal Heart rate:  151 Edema:  No [place] today:  Single, active fetus in a cephalic presentation. FHR is 141 bpm. Cervix appears open with funneling. Internal os opening measures 1.2cm and external os measures 0.5cm and Debris noted within cervix. Pessary noted.  Patient is without complaints. All questions were answered.  Assessment:  [redacted]w[redacted]d,   Cervical incompetency, deteriorating  Medication(s) Plans:  Continue prometrium qhs.  Add Procardia 30XL BID  Treatment Plan:  Continue limited activity.  Discussed with Dr. Emelda Fear. WIll continue to check cervical length q 2 weeks  Follow up in 2 weeks for appointment for high risk OB care, cx length [place]

## 2015-01-08 ENCOUNTER — Ambulatory Visit (HOSPITAL_COMMUNITY): Payer: Medicaid Other

## 2015-01-13 ENCOUNTER — Encounter: Payer: Medicaid Other | Admitting: Women's Health

## 2015-01-13 ENCOUNTER — Other Ambulatory Visit: Payer: Medicaid Other

## 2015-01-21 ENCOUNTER — Ambulatory Visit (INDEPENDENT_AMBULATORY_CARE_PROVIDER_SITE_OTHER)

## 2015-01-21 ENCOUNTER — Ambulatory Visit (INDEPENDENT_AMBULATORY_CARE_PROVIDER_SITE_OTHER): Payer: Medicaid Other | Admitting: Advanced Practice Midwife

## 2015-01-21 ENCOUNTER — Encounter: Payer: Self-pay | Admitting: Advanced Practice Midwife

## 2015-01-21 ENCOUNTER — Other Ambulatory Visit: Payer: Self-pay | Admitting: Advanced Practice Midwife

## 2015-01-21 DIAGNOSIS — O09893 Supervision of other high risk pregnancies, third trimester: Secondary | ICD-10-CM

## 2015-01-21 DIAGNOSIS — O26872 Cervical shortening, second trimester: Secondary | ICD-10-CM

## 2015-01-21 DIAGNOSIS — Z1389 Encounter for screening for other disorder: Secondary | ICD-10-CM

## 2015-01-21 DIAGNOSIS — O26873 Cervical shortening, third trimester: Secondary | ICD-10-CM

## 2015-01-21 DIAGNOSIS — Z331 Pregnant state, incidental: Secondary | ICD-10-CM

## 2015-01-21 LAB — POCT URINALYSIS DIPSTICK
Blood, UA: NEGATIVE
GLUCOSE UA: NEGATIVE
Ketones, UA: NEGATIVE
Leukocytes, UA: NEGATIVE
NITRITE UA: NEGATIVE
PROTEIN UA: NEGATIVE

## 2015-01-21 NOTE — Progress Notes (Signed)
Pt denies any problems or concerns at this time.  

## 2015-01-21 NOTE — Progress Notes (Signed)
CX Length- (30+4wks)-vtx fetus, NO CX noted to measure, pt with Pessary (shadowing out ext cx os although no pressure applied to try to evaluate due to no cx noted) Drenda Freeze was made aware

## 2015-01-21 NOTE — Progress Notes (Signed)
    High Risk Pregnancy Diagnosis(es):   Cervical insufficiency  G2P0010 [redacted]w[redacted]d Estimated Date of Delivery: 03/28/15  Blood pressure 120/80, pulse 100, weight 183 lb (83.008 kg), last menstrual period 06/15/2014.  Urinalysis: Negative   HPI: The patient is being seen today for ongoing management of shortened cervix.  MFM had recommended a cervical length after her pessary was placed, so she is here today for that, along with her ob visit.. Today she reports that her contractions are less frequent with the procardia  BP weight and urine results all reviewed and noted. Patient reports good fetal movement, denies any bleeding and no rupture of membranes symptoms or regular contractions.  Fundal Height:  32 Fetal Heart rate:  149 Edema:  no  Patient is without complaints other than noted in her HPI. All questions were answered   All lab and sonogram results have been reviewed.SONOGRAM: CX Length- (30+4wks)-vtx fetus, NO CX noted to measure, pt with Pessary (shadowing out ext cx os although no pressure applied to try to evaluate due to no cx noted) Drenda Freeze was made aware (tech ? Whether there were membranes protruding from cx os.  Pessary in place, so no speculum exam was performed.  Comments: because pt is >[redacted]weeks pregnant, she has no measurable cx, and there is nothing else to do that we are not already doing, cx ultrasounds are no longer warranted.Discussed with Dr. Despina Hidden.    Assessment:  1.  Pregnancy at [redacted]w[redacted]d,  Estimated Date of Delivery: 03/28/15 :                          2.  Incompetent cervix                          Medication(s) Plans:  Continue procardia and prometrium  Treatment Plan:  Continue modified rest, pessary  Follow up in 2 weeks for appointment for high risk OB care,

## 2015-02-04 ENCOUNTER — Ambulatory Visit (INDEPENDENT_AMBULATORY_CARE_PROVIDER_SITE_OTHER): Payer: Medicaid Other | Admitting: Obstetrics & Gynecology

## 2015-02-04 ENCOUNTER — Encounter: Payer: Self-pay | Admitting: Obstetrics & Gynecology

## 2015-02-04 VITALS — BP 128/80 | HR 80 | Wt 186.0 lb

## 2015-02-04 DIAGNOSIS — O09893 Supervision of other high risk pregnancies, third trimester: Secondary | ICD-10-CM

## 2015-02-04 DIAGNOSIS — O09213 Supervision of pregnancy with history of pre-term labor, third trimester: Secondary | ICD-10-CM

## 2015-02-04 DIAGNOSIS — Z1389 Encounter for screening for other disorder: Secondary | ICD-10-CM

## 2015-02-04 DIAGNOSIS — Z331 Pregnant state, incidental: Secondary | ICD-10-CM

## 2015-02-04 DIAGNOSIS — O26879 Cervical shortening, unspecified trimester: Secondary | ICD-10-CM

## 2015-02-04 LAB — POCT URINALYSIS DIPSTICK
Blood, UA: NEGATIVE
Glucose, UA: NEGATIVE
Ketones, UA: NEGATIVE
LEUKOCYTES UA: NEGATIVE
Nitrite, UA: NEGATIVE
Protein, UA: NEGATIVE

## 2015-02-04 NOTE — Progress Notes (Signed)
Fetal Surveillance Testing today:  none   High Risk Pregnancy Diagnosis(es):   Cervical shortening, pre term labor  G2P0010 [redacted]w[redacted]d Estimated Date of Delivery: 03/28/15  Blood pressure 128/80, pulse 80, weight 186 lb (84.369 kg), last menstrual period 06/15/2014.  Urinalysis: Negative   HPI: The patient is being seen today for ongoing management of short cervix/cervical incompetence/pre term labor. Today she reports irregular contractions   BP weight and urine results all reviewed and noted. Patient reports good fetal movement, denies any bleeding and no rupture of membranes symptoms or regular contractions.  Fundal Height:  34 Fetal Heart rate:  136 Edema:  trace  Patient is without complaints other than noted in her HPI. All questions were answered.  All lab and sonogram results have been reviewed. Comments: abnormal: no cervical length with minimal dilation   Assessment:  1.  Pregnancy at [redacted]w[redacted]d,  Estimated Date of Delivery: 03/28/15 :                          2.  Incompetent cervix in the second trimester                        3.  Pre term labor  Medication(s) Plans:  Continue procardia and prometrium, status post betamethasone  Treatment Plan:  As above  Follow up in 2 weeks for appointment for high risk OB care,

## 2015-02-10 ENCOUNTER — Telehealth: Payer: Self-pay | Admitting: Women's Health

## 2015-02-10 NOTE — Telephone Encounter (Signed)
Pt states she is very congested and has a cough, reports good FM, no cramping or loss of fluids and no fever.  Advised pt to try Mucinex or Sudafed or Robitussin Mucus and Chest congestion (alcohol free), also can use saline nasal spray and humidifier and suck on cough drops.  Pt informed to call us back if symptoms persist or worsen.  Pt verbalized understanding.

## 2015-02-15 ENCOUNTER — Encounter (HOSPITAL_COMMUNITY): Payer: Self-pay | Admitting: *Deleted

## 2015-02-15 ENCOUNTER — Inpatient Hospital Stay (HOSPITAL_COMMUNITY)
Admission: AD | Admit: 2015-02-15 | Discharge: 2015-02-16 | Disposition: A | Discharge status: Home / Self Care | Payer: Medicaid Other | Source: Ambulatory Visit | Attending: Obstetrics & Gynecology | Admitting: Obstetrics & Gynecology

## 2015-02-15 DIAGNOSIS — Z3A34 34 weeks gestation of pregnancy: Secondary | ICD-10-CM | POA: Insufficient documentation

## 2015-02-15 DIAGNOSIS — Z87891 Personal history of nicotine dependence: Secondary | ICD-10-CM | POA: Insufficient documentation

## 2015-02-15 DIAGNOSIS — O4703 False labor before 37 completed weeks of gestation, third trimester: Secondary | ICD-10-CM

## 2015-02-15 DIAGNOSIS — O3433 Maternal care for cervical incompetence, third trimester: Secondary | ICD-10-CM | POA: Diagnosis not present

## 2015-02-15 NOTE — MAU Note (Signed)
Pt reports she has been feeling contractions for the last 2 hours. Denies bleeding or ROM

## 2015-02-16 DIAGNOSIS — O4703 False labor before 37 completed weeks of gestation, third trimester: Secondary | ICD-10-CM | POA: Diagnosis not present

## 2015-02-16 LAB — URINALYSIS, ROUTINE W REFLEX MICROSCOPIC
Bilirubin Urine: NEGATIVE
GLUCOSE, UA: NEGATIVE mg/dL
Hgb urine dipstick: NEGATIVE
KETONES UR: NEGATIVE mg/dL
Leukocytes, UA: NEGATIVE
NITRITE: NEGATIVE
PH: 7 (ref 5.0–8.0)
PROTEIN: NEGATIVE mg/dL
Specific Gravity, Urine: <1.005 — ABNORMAL LOW (ref 1.005–1.030)
Urobilinogen, UA: 0.2 mg/dL (ref 0.0–1.0)

## 2015-02-16 MED ORDER — NIFEDIPINE 10 MG PO CAPS
10.0000 mg | ORAL_CAPSULE | Freq: Once | ORAL | Status: AC
  Administered 2015-02-16: 10 mg via ORAL
  Filled 2015-02-16: qty 1

## 2015-02-16 MED ORDER — NIFEDIPINE 10 MG PO CAPS: 10.0000 mg | ORAL_CAPSULE | ORAL | Status: DC | PRN

## 2015-02-16 NOTE — Discharge Instructions (Signed)
Please take your medications as prescribed to prevent preterm labor. It is very important that you continue to take Prometrium and Procardia every day until told otherwise by your physician.  Preterm Labor Information Preterm labor is when labor starts at less than 37 weeks of pregnancy. The normal length of a pregnancy is 39 to 41 weeks. CAUSES Often, there is no identifiable underlying cause as to why a woman goes into preterm labor. One of the most common known causes of preterm labor is infection. Infections of the uterus, cervix, vagina, amniotic sac, bladder, kidney, or even the lungs (pneumonia) can cause labor to start. Other suspected causes of preterm labor include:   Urogenital infections, such as yeast infections and bacterial vaginosis.   Uterine abnormalities (uterine shape, uterine septum, fibroids, or bleeding from the placenta).   A cervix that has been operated on (it may fail to stay closed).   Malformations in the fetus.   Multiple gestations (twins, triplets, and so on).   Breakage of the amniotic sac.  RISK FACTORS  Having a previous history of preterm labor.   Having premature rupture of membranes (PROM).   Having a placenta that covers the opening of the cervix (placenta previa).   Having a placenta that separates from the uterus (placental abruption).   Having a cervix that is too weak to hold the fetus in the uterus (incompetent cervix).   Having too much fluid in the amniotic sac (polyhydramnios).   Taking illegal drugs or smoking while pregnant.   Not gaining enough weight while pregnant.   Being younger than 74 and older than 23 years old.   Having a low socioeconomic status.   Being African American. SYMPTOMS Signs and symptoms of preterm labor include:   Menstrual-like cramps, abdominal pain, or back pain.  Uterine contractions that are regular, as frequent as six in an hour, regardless of their intensity (may be mild or  painful).  Contractions that start on the top of the uterus and spread down to the lower abdomen and back.   A sense of increased pelvic pressure.   A watery or bloody mucus discharge that comes from the vagina.  TREATMENT Depending on the length of the pregnancy and other circumstances, your health care provider may suggest bed rest. If necessary, there are medicines that can be given to stop contractions and to mature the fetal lungs. If labor happens before 34 weeks of pregnancy, a prolonged hospital stay may be recommended. Treatment depends on the condition of both you and the fetus.  WHAT SHOULD YOU DO IF YOU THINK YOU ARE IN PRETERM LABOR? Call your health care provider right away. You will need to go to the hospital to get checked immediately. HOW CAN YOU PREVENT PRETERM LABOR IN FUTURE PREGNANCIES? You should:   Stop smoking if you smoke.  Maintain healthy weight gain and avoid chemicals and drugs that are not necessary.  Be watchful for any type of infection.  Inform your health care provider if you have a known history of preterm labor. Document Released: 02/03/2004 Document Revised: 07/16/2013 Document Reviewed: 12/16/2012 [facility] Patient Information 2015 [place], [place]. This information is not intended to replace advice given to you by your health care provider. Make sure you discuss any questions you have with your health care provider.

## 2015-02-16 NOTE — MAU Provider Note (Signed)
History     CSN: [id number]  Arrival date and time: 02/15/15 2251   First Provider Initiated Contact with Patient 02/16/15 0054      No chief complaint on file.  HPI Mrs. Glazebrook is a 24yo G2P0010 at 34 weeks and 2 days presenting for contractions. States contractions started last night (3/21) at 9pm and occurred every . Contractions are further apart since presenting to MAU. Pain located in lower pelvis. Denies LOF. Denies vaginal bleeding or discharge. Denies edema. Continues to note fetal movement. Reports pessary is in place. Prescribed Procardia and Prometrium due to incompetent cervix in second trimester to prevent preterm labor. States she has not taken the Prometrium since yesterday and has not taken the Procardia in a few days; unsure how many days it has been. States her reason for not taking these medications is because she had not been having contractions.  States she does not need refills of these medications.  OB History    Gravida Para Term Preterm AB TAB SAB Ectopic Multiple Living   2    1 1           Past Medical History  Diagnosis Date  . Encounter for Nexplanon removal 10/21/2013  . History of termination of pregnancy 01/09/2014  . Medical history non-contributory     Past Surgical History  Procedure Laterality Date  . Knee surgery Right     Family History  Problem Relation Age of Onset  . Hypertension Maternal Grandfather   . Diabetes Maternal Grandfather     History  Substance Use Topics  . Smoking status: Former Smoker -- 0.25 packs/day  . Smokeless tobacco: Never Used  . Alcohol Use: No    Allergies: No Known Allergies  Prescriptions prior to admission  Medication Sig Dispense Refill Last Dose  . NIFEdipine (PROCARDIA XL) 30 MG 24 hr tablet Take 1 tablet (30 mg total) by mouth daily. 60 tablet 2 02/14/2015 at Unknown time  . Prenatal Vit-Fe Fumarate-FA (PRENATAL MULTIVITAMIN) TABS tablet Take 1 tablet by mouth daily at 12 noon.   02/15/2015  at Unknown time  . progesterone (PROMETRIUM) 200 MG capsule Place 1 capsule (200 mg total) vaginally daily. 60 capsule 2 Past Week at Unknown time  . fluconazole (DIFLUCAN) 150 MG tablet Take 1 tablet (150 mg total) by mouth once. (Patient not taking: Reported on 12/30/2014) 2 tablet 1 Not Taking    Review of Systems  All other systems reviewed and are negative.  Physical Exam   Blood pressure 119/75, pulse 95, resp. rate 18, height 5\' 2"  (1.575 m), weight 87.091 kg (192 lb), last menstrual period 06/15/2014, SpO2 99 %.  Physical Exam  Constitutional: She appears well-developed and well-nourished. No distress.  Cardiovascular: Normal rate and regular rhythm.  Exam reveals no gallop and no friction rub.   No murmur heard. Respiratory: Effort normal. No respiratory distress. She has no wheezes.  GI: Soft. There is no tenderness.  Musculoskeletal: She exhibits no edema.  No round ligament tenderness noted.  Cervical Exam: 1cm, pessary in place  MAU Course  Procedures  MDM Fetal Monitor: baseline 130bpm, moderate variability, + accelerations, no decelerations, irregular contractions Procardia 10mg  Encourage PO intake Cervical exam unchanged. No contractions after one dose of Procardia given.  Assessment and Plan  Stable for discharge. Stressed importance of taking Procardia and Prometrium as prescribed. Follow up at [facility] on Thursday, 3/24. Return precautions given. Handout on pre-term labor given.   02/16/2015, 12:54 AM  Seen and examined by me also Cervix checked per patient's insistence, though Dr Despina Hidden had told her he would not check her anymore Pessary in place. Cervix feels soft, difficult to palpate, approx 1cm, 2cm long/soft. PP not felt FHR reactive UCs almost stopped, still has the occasional one but does not feel it  A:  SIUP at [redacted]w[redacted]d       Preterm contractions      History preterm cervical effacement with pessary placement      Reassuring  fetal heart rate,  Category I   P:  DIscharge home       Reassured        PTL precautions       Advised to resume medication as ordered       Followup in office as scheduled

## 2015-02-18 ENCOUNTER — Ambulatory Visit (INDEPENDENT_AMBULATORY_CARE_PROVIDER_SITE_OTHER): Payer: Medicaid Other | Admitting: Advanced Practice Midwife

## 2015-02-18 ENCOUNTER — Encounter: Payer: Self-pay | Admitting: Advanced Practice Midwife

## 2015-02-18 VITALS — BP 120/80 | HR 84 | Wt 189.0 lb

## 2015-02-18 DIAGNOSIS — Z331 Pregnant state, incidental: Secondary | ICD-10-CM

## 2015-02-18 DIAGNOSIS — O09893 Supervision of other high risk pregnancies, third trimester: Secondary | ICD-10-CM

## 2015-02-18 DIAGNOSIS — O26879 Cervical shortening, unspecified trimester: Secondary | ICD-10-CM | POA: Diagnosis not present

## 2015-02-18 DIAGNOSIS — Z1389 Encounter for screening for other disorder: Secondary | ICD-10-CM

## 2015-02-18 LAB — POCT URINALYSIS DIPSTICK
Glucose, UA: NEGATIVE
Ketones, UA: NEGATIVE
Leukocytes, UA: NEGATIVE
Nitrite, UA: NEGATIVE
PROTEIN UA: NEGATIVE
RBC UA: NEGATIVE

## 2015-02-18 NOTE — Patient Instructions (Signed)

## 2015-02-18 NOTE — Progress Notes (Signed)
Fetal Surveillance Testing today:  none   High Risk Pregnancy Diagnosis(es):   Cervical insufficiency   G2P0010 [redacted]w[redacted]d Estimated Date of Delivery: 03/28/15  Last menstrual period 06/15/2014.  Urinalysis: Negative   HPI: The patient is being seen today for ongoing management of pregnancy and cervical insufficiency.  Pt admitted to family that she has never used prometrium and only took procardia a few times.  She still has a pessary. Today she reports irregular contractions, c/w what she has been feeling for weeks.  Went to MAU 2 nights ago with q 5 minute ctx, stopped after one dose of procardia. Per CNM,  "difficult to assess" vaginal exam (at pts insistance) she was 1/soft/long/pp not felt BP weight and urine results all reviewed and noted.. Patient reports good fetal movement, denies any bleeding and no rupture of membranes symptoms   Fundal Height:  35  Fetal Heart rate:  159 Edema:  no  Patient is without complaints other than noted in her HPI. All questions were answered.  All lab and sonogram results have been reviewed. Comments: No measurable cx with minimal dilation  Assessment:  1.  Pregnancy at [redacted]w[redacted]d,  Estimated Date of Delivery: 03/28/15 :                            2.  Cervical insufficiency                        3.  Preterm labor s/p BMZ.  Pt is now tired of being pregnant, and is obviously not committed to preventing PTL.  Stressed to pt that she is very very lucky that she has not delivered early, and that noncompliance with treatment and not being honest about it threatens her provider/pt relationship.   Medication(s) Plans:  May D/C procardia if she wants, or continue to take it prn uncomfortable ctx.  Pt is not going to use prometruim for 2 more weeks, bu tit was recommended anyway.   Treatment Plan:  Continue pessary until 36 weeks. VERIFY PRESENTATION NEXT VISIT after removal of pessary  Follow up in 2 weeks for appointment for high risk OB care,

## 2015-02-18 NOTE — Progress Notes (Signed)
Pt states that she has had a lot of mucous discharge.

## 2015-03-04 ENCOUNTER — Ambulatory Visit (INDEPENDENT_AMBULATORY_CARE_PROVIDER_SITE_OTHER): Payer: Medicaid Other | Admitting: Obstetrics & Gynecology

## 2015-03-04 ENCOUNTER — Encounter: Payer: Self-pay | Admitting: Obstetrics & Gynecology

## 2015-03-04 VITALS — BP 134/80 | HR 84 | Wt 191.0 lb

## 2015-03-04 DIAGNOSIS — O09893 Supervision of other high risk pregnancies, third trimester: Secondary | ICD-10-CM

## 2015-03-04 DIAGNOSIS — Z1159 Encounter for screening for other viral diseases: Secondary | ICD-10-CM

## 2015-03-04 DIAGNOSIS — Z1389 Encounter for screening for other disorder: Secondary | ICD-10-CM

## 2015-03-04 DIAGNOSIS — Z118 Encounter for screening for other infectious and parasitic diseases: Secondary | ICD-10-CM

## 2015-03-04 DIAGNOSIS — O26879 Cervical shortening, unspecified trimester: Secondary | ICD-10-CM | POA: Diagnosis not present

## 2015-03-04 DIAGNOSIS — Z331 Pregnant state, incidental: Secondary | ICD-10-CM | POA: Diagnosis not present

## 2015-03-04 DIAGNOSIS — Z3685 Encounter for antenatal screening for Streptococcus B: Secondary | ICD-10-CM

## 2015-03-04 LAB — POCT URINALYSIS DIPSTICK
Blood, UA: NEGATIVE
Glucose, UA: NEGATIVE
Ketones, UA: NEGATIVE
LEUKOCYTES UA: NEGATIVE
NITRITE UA: NEGATIVE
PROTEIN UA: NEGATIVE

## 2015-03-04 LAB — OB RESULTS CONSOLE GBS: GBS: NEGATIVE

## 2015-03-04 LAB — OB RESULTS CONSOLE GC/CHLAMYDIA
CHLAMYDIA, DNA PROBE: NEGATIVE
Gonorrhea: NEGATIVE

## 2015-03-04 NOTE — Progress Notes (Signed)
Fetal Surveillance Testing today:  none   High Risk Pregnancy Diagnosis(es): Short cervix, assymptomatic  G2P0010 [redacted]w[redacted]d Estimated Date of Delivery: 03/28/15  Blood pressure 134/80, pulse 84, weight 191 lb (86.637 kg), last menstrual period 06/15/2014.  Urinalysis: Negative   HPI: The patient is being seen today for ongoing management of short cervix. Today she reports pressure   BP weight and urine results all reviewed and noted. Patient reports good fetal movement, denies any bleeding and no rupture of membranes symptoms or regular contractions.  Fundal Height:  37 Fetal Heart rate:  141 Edema:  trace  Patient is without complaints other than noted in her HPI. All questions were answered.  All lab and sonogram results have been reviewed. Comments: normal   Assessment:  1.  Pregnancy at [redacted]w[redacted]d,  Estimated Date of Delivery: 03/28/15 :                          2.  Short cervix                        3.    Medication(s) Plans:  No changes  Treatment Plan:  1 Follow up in 1 weeks for appointment for high risk OB care,

## 2015-03-05 LAB — GC/CHLAMYDIA PROBE AMP
Chlamydia trachomatis, NAA: NEGATIVE
Neisseria gonorrhoeae by PCR: NEGATIVE

## 2015-03-08 LAB — CULTURE, BETA STREP (GROUP B ONLY): Strep Gp B Culture: NEGATIVE

## 2015-03-11 ENCOUNTER — Ambulatory Visit (INDEPENDENT_AMBULATORY_CARE_PROVIDER_SITE_OTHER): Payer: Medicaid Other | Admitting: Obstetrics & Gynecology

## 2015-03-11 ENCOUNTER — Encounter: Payer: Self-pay | Admitting: Obstetrics & Gynecology

## 2015-03-11 VITALS — BP 134/78 | HR 92 | Wt 192.5 lb

## 2015-03-11 DIAGNOSIS — O09893 Supervision of other high risk pregnancies, third trimester: Secondary | ICD-10-CM | POA: Diagnosis not present

## 2015-03-11 DIAGNOSIS — Z1389 Encounter for screening for other disorder: Secondary | ICD-10-CM

## 2015-03-11 DIAGNOSIS — Z331 Pregnant state, incidental: Secondary | ICD-10-CM

## 2015-03-11 DIAGNOSIS — O26879 Cervical shortening, unspecified trimester: Secondary | ICD-10-CM

## 2015-03-11 LAB — POCT URINALYSIS DIPSTICK
GLUCOSE UA: NEGATIVE
KETONES UA: NEGATIVE
Nitrite, UA: NEGATIVE
PROTEIN UA: NEGATIVE

## 2015-03-11 MED ORDER — OMEPRAZOLE 20 MG PO CPDR: 20.0000 mg | DELAYED_RELEASE_CAPSULE | Freq: Every day | ORAL | Status: DC

## 2015-03-11 NOTE — Addendum Note (Signed)
Addended by: Lazaro Arms on: 03/11/2015 12:04 PM   Modules accepted: Orders

## 2015-03-11 NOTE — Progress Notes (Signed)
Fetal Surveillance Testing today:  none   High Risk Pregnancy Diagnosis(es):   Short cervix  G2P0010 [redacted]w[redacted]d Estimated Date of Delivery: 03/28/15  Blood pressure 134/78, pulse 92, weight 192 lb 8 oz (87.317 kg), last menstrual period 06/15/2014.  Urinalysis: Negative   HPI: The patient is being seen today for ongoing management of short cervix. Today she reports irregular contractions   BP weight and urine results all reviewed and noted. Patient reports good fetal movement, denies any bleeding and no rupture of membranes symptoms or regular contractions.  Fundal Height:  38 Fetal Heart rate:  155 Edema:  none  Patient is without complaints other than noted in her HPI. All questions were answered.  All lab and sonogram results have been reviewed. Comments: normal   Assessment:  1.  Pregnancy at [redacted]w[redacted]d,  Estimated Date of Delivery: 03/28/15 :                          2.  Short cervix, now term                        3.    Medication(s) Plans:  No changes  Treatment Plan:    Follow up in 1 weeks for appointment for high risk OB care,

## 2015-03-15 ENCOUNTER — Inpatient Hospital Stay (HOSPITAL_COMMUNITY)
Admission: AD | Admit: 2015-03-15 | Discharge: 2015-03-15 | Disposition: A | Discharge status: Home / Self Care | Payer: Medicaid Other | Source: Ambulatory Visit | Attending: Obstetrics and Gynecology | Admitting: Obstetrics and Gynecology

## 2015-03-15 ENCOUNTER — Encounter (HOSPITAL_COMMUNITY): Payer: Self-pay | Admitting: *Deleted

## 2015-03-15 DIAGNOSIS — Z3A38 38 weeks gestation of pregnancy: Secondary | ICD-10-CM | POA: Diagnosis not present

## 2015-03-15 NOTE — MAU Note (Signed)
UC's for 3 days, worse today. No recent exam.

## 2015-03-18 ENCOUNTER — Encounter (HOSPITAL_COMMUNITY): Payer: Self-pay | Admitting: *Deleted

## 2015-03-18 ENCOUNTER — Inpatient Hospital Stay (HOSPITAL_COMMUNITY): Payer: Medicaid Other | Admitting: Anesthesiology

## 2015-03-18 ENCOUNTER — Inpatient Hospital Stay (HOSPITAL_COMMUNITY)
Admission: AD | Admit: 2015-03-18 | Discharge: 2015-03-22 | DRG: 766 | Disposition: A | Discharge status: Home / Self Care | Payer: Medicaid Other | Source: Ambulatory Visit | Attending: Family Medicine | Admitting: Family Medicine

## 2015-03-18 ENCOUNTER — Encounter: Payer: Self-pay | Admitting: Advanced Practice Midwife

## 2015-03-18 ENCOUNTER — Ambulatory Visit (INDEPENDENT_AMBULATORY_CARE_PROVIDER_SITE_OTHER): Payer: Medicaid Other | Admitting: Advanced Practice Midwife

## 2015-03-18 VITALS — BP 130/90 | HR 80 | Wt 200.0 lb

## 2015-03-18 DIAGNOSIS — O09893 Supervision of other high risk pregnancies, third trimester: Secondary | ICD-10-CM

## 2015-03-18 DIAGNOSIS — Z3A38 38 weeks gestation of pregnancy: Secondary | ICD-10-CM | POA: Diagnosis present

## 2015-03-18 DIAGNOSIS — O339 Maternal care for disproportion, unspecified: Secondary | ICD-10-CM | POA: Diagnosis present

## 2015-03-18 DIAGNOSIS — Z331 Pregnant state, incidental: Secondary | ICD-10-CM | POA: Diagnosis not present

## 2015-03-18 DIAGNOSIS — Z1389 Encounter for screening for other disorder: Secondary | ICD-10-CM

## 2015-03-18 DIAGNOSIS — O26879 Cervical shortening, unspecified trimester: Secondary | ICD-10-CM

## 2015-03-18 DIAGNOSIS — Z3490 Encounter for supervision of normal pregnancy, unspecified, unspecified trimester: Secondary | ICD-10-CM

## 2015-03-18 DIAGNOSIS — Z3483 Encounter for supervision of other normal pregnancy, third trimester: Secondary | ICD-10-CM | POA: Diagnosis present

## 2015-03-18 LAB — CBC
HEMATOCRIT: 33.5 % — AB (ref 36.0–46.0)
HEMOGLOBIN: 12 g/dL (ref 12.0–15.0)
MCH: 29.8 pg (ref 26.0–34.0)
MCHC: 35.8 g/dL (ref 30.0–36.0)
MCV: 83.1 fL (ref 78.0–100.0)
Platelets: 321 10*3/uL (ref 150–400)
RBC: 4.03 MIL/uL (ref 3.87–5.11)
RDW: 14.6 % (ref 11.5–15.5)
WBC: 11.5 10*3/uL — AB (ref 4.0–10.5)

## 2015-03-18 LAB — ABO/RH: ABO/RH(D): O POS

## 2015-03-18 LAB — POCT URINALYSIS DIPSTICK
Glucose, UA: NEGATIVE
KETONES UA: NEGATIVE
Leukocytes, UA: NEGATIVE
Nitrite, UA: NEGATIVE
Protein, UA: NEGATIVE
RBC UA: NEGATIVE

## 2015-03-18 MED ORDER — LACTATED RINGERS IV SOLN
INTRAVENOUS | Status: DC
  Administered 2015-03-18 – 2015-03-19 (×7): via INTRAVENOUS

## 2015-03-18 MED ORDER — LIDOCAINE HCL (PF) 1 % IJ SOLN: 30.0000 mL | INTRAMUSCULAR | Status: DC | PRN

## 2015-03-18 MED ORDER — OXYCODONE-ACETAMINOPHEN 5-325 MG PO TABS: 2.0000 | ORAL_TABLET | ORAL | Status: DC | PRN

## 2015-03-18 MED ORDER — LACTATED RINGERS IV SOLN
500.0000 mL | INTRAVENOUS | Status: DC | PRN
  Administered 2015-03-18 – 2015-03-19 (×4): 500 mL via INTRAVENOUS
  Administered 2015-03-19: 15:00:00 via INTRAVENOUS

## 2015-03-18 MED ORDER — ONDANSETRON HCL 4 MG/2ML IJ SOLN
4.0000 mg | Freq: Four times a day (QID) | INTRAMUSCULAR | Status: DC | PRN
  Administered 2015-03-19: 4 mg via INTRAVENOUS

## 2015-03-18 MED ORDER — OXYTOCIN 40 UNITS IN LACTATED RINGERS INFUSION - SIMPLE MED
1.0000 m[IU]/min | INTRAVENOUS | Status: DC
  Administered 2015-03-18: 1 m[IU]/min via INTRAVENOUS

## 2015-03-18 MED ORDER — FLEET ENEMA 7-19 GM/118ML RE ENEM
1.0000 | ENEMA | RECTAL | Status: DC | PRN
Start: 2015-03-18 — End: 2015-03-19

## 2015-03-18 MED ORDER — CITRIC ACID-SODIUM CITRATE 334-500 MG/5ML PO SOLN
30.0000 mL | ORAL | Status: DC | PRN
  Administered 2015-03-19: 30 mL via ORAL

## 2015-03-18 MED ORDER — EPHEDRINE 5 MG/ML INJ: 10.0000 mg | INTRAVENOUS | Status: DC | PRN

## 2015-03-18 MED ORDER — OXYCODONE-ACETAMINOPHEN 5-325 MG PO TABS: 1.0000 | ORAL_TABLET | ORAL | Status: DC | PRN

## 2015-03-18 MED ORDER — LIDOCAINE HCL (PF) 1 % IJ SOLN
INTRAMUSCULAR | Status: DC | PRN
  Administered 2015-03-18: 4 mL
  Administered 2015-03-18: 6 mL

## 2015-03-18 MED ORDER — DIPHENHYDRAMINE HCL 50 MG/ML IJ SOLN: 12.5000 mg | INTRAMUSCULAR | Status: DC | PRN

## 2015-03-18 MED ORDER — FENTANYL 2.5 MCG/ML BUPIVACAINE 1/10 % EPIDURAL INFUSION (WH - ANES)
14.0000 mL/h | INTRAMUSCULAR | Status: DC | PRN
  Administered 2015-03-18 – 2015-03-19 (×3): 14 mL/h via EPIDURAL
  Filled 2015-03-18 (×3): qty 125

## 2015-03-18 MED ORDER — ACETAMINOPHEN 325 MG PO TABS: 650.0000 mg | ORAL_TABLET | ORAL | Status: DC | PRN

## 2015-03-18 MED ORDER — OXYTOCIN 40 UNITS IN LACTATED RINGERS INFUSION - SIMPLE MED: 1.0000 m[IU]/min | INTRAVENOUS | Status: DC

## 2015-03-18 MED ORDER — OXYTOCIN BOLUS FROM INFUSION: 500.0000 mL | INTRAVENOUS | Status: DC

## 2015-03-18 MED ORDER — TERBUTALINE SULFATE 1 MG/ML IJ SOLN: 0.2500 mg | Freq: Once | INTRAMUSCULAR | Status: AC | PRN

## 2015-03-18 MED ORDER — PHENYLEPHRINE 40 MCG/ML (10ML) SYRINGE FOR IV PUSH (FOR BLOOD PRESSURE SUPPORT)
80.0000 ug | PREFILLED_SYRINGE | INTRAVENOUS | Status: DC | PRN
  Administered 2015-03-19: 80 ug via INTRAVENOUS
  Administered 2015-03-19: 40 ug via INTRAVENOUS
  Filled 2015-03-18: qty 20

## 2015-03-18 MED ORDER — PHENYLEPHRINE 40 MCG/ML (10ML) SYRINGE FOR IV PUSH (FOR BLOOD PRESSURE SUPPORT)
80.0000 ug | PREFILLED_SYRINGE | INTRAVENOUS | Status: DC | PRN
  Administered 2015-03-18: 80 ug via INTRAVENOUS

## 2015-03-18 MED ORDER — LACTATED RINGERS IV SOLN: 500.0000 mL | Freq: Once | INTRAVENOUS | Status: DC

## 2015-03-18 MED ORDER — OXYTOCIN 40 UNITS IN LACTATED RINGERS INFUSION - SIMPLE MED
62.5000 mL/h | INTRAVENOUS | Status: DC
  Filled 2015-03-18: qty 1000

## 2015-03-18 NOTE — Progress Notes (Signed)
   Morgan Conrad is a 24 y.o. G2P0010 at [redacted]w[redacted]d  admitted for active labor, PROM  Subjective: Comfortable with epidural  Objective: Filed Vitals:   03/18/15 2045 03/18/15 2100 03/18/15 2130 03/18/15 2200  BP: 104/79 121/72 109/58 127/80  Pulse: 110 89 91 100  Temp:  98 F (36.7 C)    TempSrc:  Oral    Resp: 18 18 18 18   Height:      Weight:      SpO2:    99%   Total I/O In: -  Out: 800 [Urine:800]  FHT:  FHR: 145 bpm, variability: moderate,  accelerations:  Present,  decelerations:  Present intermittent late decels with occ prolonged variable decel.  Responded to position changes and IVF bolus UC:   MVU's 150. Ctx q 4 minutes SVE:   Dilation: 4 Effacement (%): 100 Station: -2 Exam by:: Cresenso-Dishmon, CNM    Labs: Lab Results  Component Value Date   WBC 11.5* 03/18/2015   HGB 12.0 03/18/2015   HCT 33.5* 03/18/2015   MCV 83.1 03/18/2015   PLT 321 03/18/2015    Assessment / Plan: Protracted latent phase Will probably need to augment. However, given recent decels, will try the peanut ball for a little while and allow baby to recuperate before starting pitocin Labor: slow Fetal Wellbeing:  Category I and Category II Pain Control:  Epidural Anticipated MOD:  NSVD  CRESENZO-DISHMAN,Azael Ragain 03/18/2015, 10:28 PM

## 2015-03-18 NOTE — Progress Notes (Signed)
   Morgan Conrad is a 24 y.o. G2P0010 at [redacted]w[redacted]d  admitted for PROM  Subjective: Comfortable with epidural Objective: Filed Vitals:   03/18/15 2045 03/18/15 2100 03/18/15 2130 03/18/15 2200  BP: 104/79 121/72 109/58 127/80  Pulse: 110 89 91 100  Temp:  98 F (36.7 C)    TempSrc:  Oral    Resp: 18 18 18 18   Height:      Weight:      SpO2:    99%   Total I/O In: -  Out: 800 [Urine:800]  FHT:  FHR: 145 bpm, variability: moderate,  accelerations:  Present,  decelerations:  Absent UC:   regular, every 3-4 minutes SVE:   Dilation: 4 Effacement (%): 100 Station: -2 Exam by:: Cresenso-Dishmon, CNM    Labs: Lab Results  Component Value Date   WBC 11.5* 03/18/2015   HGB 12.0 03/18/2015   HCT 33.5* 03/18/2015   MCV 83.1 03/18/2015   PLT 321 03/18/2015    Assessment / Plan: Spontaneous labor, progressing normally  Labor: Progressing normally Fetal Wellbeing:  Category I Pain Control:  Epidural Anticipated MOD:  NSVD  CRESENZO-DISHMAN,Graig Hessling 03/18/2015, 10:27 PM

## 2015-03-18 NOTE — Progress Notes (Signed)
   Morgan Conrad is a 24 y.o. G2P0010 at [redacted]w[redacted]d  admitted for PROM  Subjective:  comfoprtable with epidural Objective: Filed Vitals:   03/18/15 2130 03/18/15 2200 03/18/15 2230 03/18/15 2300  BP: 109/58 127/80 122/74 125/69  Pulse: 91 100 82 93  Temp:      TempSrc:      Resp: 18 18 18 18   Height:      Weight:      SpO2:  99% 99% 100%   Total I/O In: -  Out: 800 [Urine:800]  FHT:  FHR: 145 bpm, variability: moderate,  accelerations:  Present,  decelerations:  Absent UC:   irregular, every 2-6 minutes; MVU ~ 150 SVE:   Dilation: 4 Effacement (%): 100 Station: -2 Exam by:: [email or web address], RN    Labs: Lab Results  Component Value Date   WBC 11.5* 03/18/2015   HGB 12.0 03/18/2015   HCT 33.5* 03/18/2015   MCV 83.1 03/18/2015   PLT 321 03/18/2015    Assessment / Plan: Protracted latent phase No decels in >1 hour. Will start pitocin Labor: slowing down Fetal Wellbeing:  Category I Pain Control:  Epidural Anticipated MOD:  NSVD  CRESENZO-DISHMAN,Giulio Bertino 03/18/2015, 11:32 PM

## 2015-03-18 NOTE — H&P (Signed)
LABOR ADMISSION HISTORY AND PHYSICAL  Iracema Lanagan is a 24 y.o. female G2P0010 with IUP at [redacted]w[redacted]d by U/S presenting for spontaneous rupture of membranes. She had been having contractions irregularly for the past 3 days. This morning around midnight she noticed that there was a large passing of fluid without trying to urinate when she sat on the toilet. Then starting at 0300 she noticed regular contractions, 7/10 in intensity, occuring every 2-3 minutes and lasting about a minute each. She reports +FMs, no VB, no blurry vision, headaches, or RUQ pain. Some peripheral edema present. She plans on bottle feeding. She is undecided on birth control, considering Nexplanon.  Dating: By U/S --->  Estimated Date of Delivery: 03/28/15  Sono:    @[redacted]w[redacted]d , CWD, normal anatomy, cephalic presentation,  1048g, 48% EFW   Prenatal History/Complications:  Past Medical History: Past Medical History  Diagnosis Date  . Encounter for Nexplanon removal 10/21/2013  . History of termination of pregnancy 01/09/2014  . Medical history non-contributory     Past Surgical History: Past Surgical History  Procedure Laterality Date  . Knee surgery Right     Obstetrical History: OB History    Gravida Para Term Preterm AB TAB SAB Ectopic Multiple Living   2    1 1     0      Social History: History   Social History  . Marital Status: Married    Spouse Name: N/A  . Number of Children: N/A  . Years of Education: N/A   Social History Main Topics  . Smoking status: Former Smoker -- 0.25 packs/day    Types: Cigarettes  . Smokeless tobacco: Never Used  . Alcohol Use: No  . Drug Use: No  . Sexual Activity: Yes    Birth Control/ Protection: None   Other Topics Concern  . None   Social History Narrative    Family History: Family History  Problem Relation Age of Onset  . Hypertension Maternal Grandfather   . Diabetes Maternal Grandfather     Allergies: No Known Allergies  Prescriptions prior to  admission  Medication Sig Dispense Refill Last Dose  . omeprazole (PRILOSEC) 20 MG capsule Take 1 capsule (20 mg total) by mouth daily. 1 tablet a day (Patient not taking: Reported on 03/15/2015) 30 capsule 6 Not Taking  . Prenatal Vit-Fe Fumarate-FA (PRENATAL MULTIVITAMIN) TABS tablet Take 1 tablet by mouth daily at 12 noon.   Taking     Review of Systems   All systems reviewed and negative except as stated in HPI  Blood pressure 140/86, pulse 97, temperature 98 F (36.7 C), temperature source Oral, resp. rate 20, height 5' 2.5" (1.588 m), weight 200 lb (90.719 kg), last menstrual period 06/15/2014. General appearance: alert, cooperative and no distress Lungs: clear to auscultation bilaterally, normal work of breathing Heart: regular rate and rhythm, normal S1/S2 Abdomen: soft, non-tender; Extremities: no sign of DVT, 1+ edema Presentation: cephalic Fetal monitoringBaseline: 135 bpm, Variability: Good {> 6 bpm), Accelerations: Reactive Uterine activityDate/time of onset: 4/21 @0300 , Frequency: Every 2-3 minutes, Duration: 60 seconds and Intensity: moderate Dilation: 1.5 Effacement (%): 100 Station: -2 Exam by:: k fields, rn   Prenatal labs: ABO, Rh: --/--/O POS (04/21 1405) Antibody: PENDING (04/21 1405) Rubella:  Immune RPR: Non Reactive (02/03 0905)  HBsAg: NEGATIVE (09/16 1134)  HIV: NONREACTIVE (09/16 1134)  GBS: Negative (04/07 0000) Negative 1 hr Glucola 133 Genetic screening  normal Anatomy [birth year]1994 normal   [facility]  FOB [place], 10-15-1997, 1st  baby, married  Dating By 5wk u/s  Pap 10/15/13: neg  GC/CT Initial:  -/-              36+wks:  Genetic Screen NT/IT: normal  CF screen neg  Anatomic [place] normal  Flu vaccine At work  Tdap Recommended ~ 28wks  Glucose Screen  2 hr  80/133/131  GBS negative  Feed Preference   Contraception   Circumcision   Childbirth Classes   Pediatrician      Results for orders placed or performed during the hospital  encounter of 03/18/15 (from the past 24 hour(s))  CBC   Collection Time: 03/18/15  2:05 PM  Result Value Ref Range   WBC 11.5 (H) 4.0 - 10.5 K/uL   RBC 4.03 3.87 - 5.11 MIL/uL   Hemoglobin 12.0 12.0 - 15.0 g/dL   HCT 93.9 (L) 03.0 - 09.2 %   MCV 83.1 78.0 - 100.0 fL   MCH 29.8 26.0 - 34.0 pg   MCHC 35.8 30.0 - 36.0 g/dL   RDW 33.0 07.6 - 22.6 %   Platelets 321 150 - 400 K/uL  Type and screen   Collection Time: 03/18/15  2:05 PM  Result Value Ref Range   ABO/RH(D) O POS    Antibody Screen PENDING    Sample Expiration 03/21/2015   Results for orders placed or performed in visit on 03/18/15 (from the past 24 hour(s))  POCT urinalysis dipstick   Collection Time: 03/18/15 11:56 AM  Result Value Ref Range   Color, UA     Clarity, UA     Glucose, UA neg    Bilirubin, UA     Ketones, UA neg    Spec Grav, UA     Blood, UA neg    pH, UA     Protein, UA neg    Urobilinogen, UA     Nitrite, UA neg    Leukocytes, UA Negative     Patient Active Problem List   Diagnosis Date Noted  . Pregnancy 03/18/2015  . Cervical insufficiency during pregnancy in second trimester, antepartum   . Vaginal bleeding during pregnancy, antepartum 12/10/2014  . Cervical shortening affecting pregnancy   . Vaginal bleeding   . Smoker 10/12/2014  . Supervision of other high-risk pregnancy 08/12/2014  . History of termination of pregnancy 01/09/2014  . SUBLUXATION PATELLAR (MALALIGNMENT) 08/11/2008    Assessment: Floye Fesler is a 24 y.o. G2P0010 at [redacted]w[redacted]d here for spontaneous rupture of membranes with hx of cervical insufficiency s/p pessary  #Labor:Expectant management, AROM redundant bag this exam, intermittent monitoring.  If no cervical change at next exam will start pitocin.  Drenda Freeze reported cervix was very posterior, on exam cervix anterior, hopefully has made cervical change since appt this morning and will not necessitate any further augmentation. #Pain: Natural birth desired #FWB: Cat 1 #ID:   GBS negative #MOF: Bottle #MOC:Undecided, considering Nexplanon   Airyonna Franklyn ROCIO, MD  03/18/2015, 3:00 PM

## 2015-03-18 NOTE — Anesthesia Procedure Notes (Signed)

## 2015-03-18 NOTE — Progress Notes (Signed)
Fetal Surveillance Testing today:  none   High Risk Pregnancy Diagnosis(es):   Short cervix  G2P0010 [redacted]w[redacted]d Estimated Date of Delivery: 03/28/15  Blood pressure 130/90, pulse 80, weight 200 lb (90.719 kg), last menstrual period 06/15/2014.  Urinalysis: Negative   HPI: The patient is being seen today for ongoing management of pregnancy.. Today she reports leaking fluid since midnight last night.  Has been contracting irregularily for days, now stronger and q 3-4 minutes BP weight and urine results all reviewed and noted. Patient reports good fetal movement, denies any bleeding.  SSE:  Copious amount clear fluid from vagina.  Cx 1-2/100/-2 Fetal Heart rate:  149 Edema:  2+ pedal  Patient is without complaints other than noted in her HPI. All questions were answered.  All lab and sonogram results have been reviewed. Comments: normal   Assessment:  1.  Pregnancy at [redacted]w[redacted]d,  Estimated Date of Delivery: 03/28/15 :  PROM Treatment Plan:  To L&D  Follow up in 6 weeks for postpartum appt

## 2015-03-18 NOTE — Anesthesia Preprocedure Evaluation (Signed)

## 2015-03-19 ENCOUNTER — Encounter (HOSPITAL_COMMUNITY): Payer: Self-pay | Admitting: Anesthesiology

## 2015-03-19 ENCOUNTER — Encounter (HOSPITAL_COMMUNITY)
Admission: AD | Disposition: A | Discharge status: Home / Self Care | Payer: Self-pay | Source: Ambulatory Visit | Attending: Family Medicine

## 2015-03-19 DIAGNOSIS — Z3A38 38 weeks gestation of pregnancy: Secondary | ICD-10-CM

## 2015-03-19 DIAGNOSIS — O339 Maternal care for disproportion, unspecified: Secondary | ICD-10-CM

## 2015-03-19 LAB — CBC
HCT: 23.5 % — ABNORMAL LOW (ref 36.0–46.0)
HEMOGLOBIN: 8.2 g/dL — AB (ref 12.0–15.0)
MCH: 29.4 pg (ref 26.0–34.0)
MCHC: 34.9 g/dL (ref 30.0–36.0)
MCV: 84.2 fL (ref 78.0–100.0)
Platelets: 286 10*3/uL (ref 150–400)
RBC: 2.79 MIL/uL — AB (ref 3.87–5.11)
RDW: 14.7 % (ref 11.5–15.5)
WBC: 26.2 10*3/uL — ABNORMAL HIGH (ref 4.0–10.5)

## 2015-03-19 LAB — PREPARE RBC (CROSSMATCH)

## 2015-03-19 LAB — RPR: RPR: NONREACTIVE

## 2015-03-19 SURGERY — Surgical Case
Anesthesia: Epidural

## 2015-03-19 MED ORDER — DIPHENHYDRAMINE HCL 50 MG/ML IJ SOLN: 12.5000 mg | INTRAMUSCULAR | Status: DC | PRN

## 2015-03-19 MED ORDER — TETANUS-DIPHTH-ACELL PERTUSSIS 5-2.5-18.5 LF-MCG/0.5 IM SUSP
0.5000 mL | Freq: Once | INTRAMUSCULAR | Status: AC
  Administered 2015-03-20: 0.5 mL via INTRAMUSCULAR

## 2015-03-19 MED ORDER — ONDANSETRON HCL 4 MG/2ML IJ SOLN: 4.0000 mg | Freq: Three times a day (TID) | INTRAMUSCULAR | Status: DC | PRN

## 2015-03-19 MED ORDER — SENNOSIDES-DOCUSATE SODIUM 8.6-50 MG PO TABS
2.0000 | ORAL_TABLET | ORAL | Status: DC
  Administered 2015-03-20 – 2015-03-22 (×3): 2 via ORAL
  Filled 2015-03-19 (×3): qty 2

## 2015-03-19 MED ORDER — NALBUPHINE HCL 10 MG/ML IJ SOLN: 5.0000 mg | INTRAMUSCULAR | Status: DC | PRN

## 2015-03-19 MED ORDER — DIPHENHYDRAMINE HCL 25 MG PO CAPS: 25.0000 mg | ORAL_CAPSULE | ORAL | Status: DC | PRN

## 2015-03-19 MED ORDER — SIMETHICONE 80 MG PO CHEW: 80.0000 mg | CHEWABLE_TABLET | ORAL | Status: DC | PRN

## 2015-03-19 MED ORDER — FENTANYL CITRATE (PF) 100 MCG/2ML IJ SOLN
INTRAMUSCULAR | Status: AC
  Filled 2015-03-19: qty 2

## 2015-03-19 MED ORDER — NALBUPHINE HCL 10 MG/ML IJ SOLN: 5.0000 mg | Freq: Once | INTRAMUSCULAR | Status: DC | PRN

## 2015-03-19 MED ORDER — NALOXONE HCL 1 MG/ML IJ SOLN: 1.0000 ug/kg/h | INTRAVENOUS | Status: DC | PRN

## 2015-03-19 MED ORDER — DIPHENHYDRAMINE HCL 25 MG PO CAPS: 25.0000 mg | ORAL_CAPSULE | Freq: Four times a day (QID) | ORAL | Status: DC | PRN

## 2015-03-19 MED ORDER — MEPERIDINE HCL 25 MG/ML IJ SOLN: 6.2500 mg | INTRAMUSCULAR | Status: DC | PRN

## 2015-03-19 MED ORDER — BUPIVACAINE LIPOSOME 1.3 % IJ SUSP
INTRAMUSCULAR | Status: DC | PRN
  Administered 2015-03-19: 20 mL

## 2015-03-19 MED ORDER — CEFAZOLIN SODIUM-DEXTROSE 2-3 GM-% IV SOLR
2.0000 g | INTRAVENOUS | Status: AC
  Administered 2015-03-19: 2 g via INTRAVENOUS

## 2015-03-19 MED ORDER — ALBUMIN HUMAN 5 % IV SOLN
INTRAVENOUS | Status: AC
  Filled 2015-03-19: qty 250

## 2015-03-19 MED ORDER — DIPHENHYDRAMINE HCL 25 MG PO CAPS
25.0000 mg | ORAL_CAPSULE | ORAL | Status: DC | PRN
Start: 2015-03-19 — End: 2015-03-19
  Filled 2015-03-19: qty 1

## 2015-03-19 MED ORDER — NALOXONE HCL 0.4 MG/ML IJ SOLN
0.4000 mg | INTRAMUSCULAR | Status: DC | PRN
Start: 2015-03-19 — End: 2015-03-19

## 2015-03-19 MED ORDER — ACETAMINOPHEN 325 MG PO TABS: 650.0000 mg | ORAL_TABLET | ORAL | Status: DC | PRN

## 2015-03-19 MED ORDER — MORPHINE SULFATE 0.5 MG/ML IJ SOLN
INTRAMUSCULAR | Status: AC
  Filled 2015-03-19: qty 10

## 2015-03-19 MED ORDER — SODIUM CHLORIDE 0.9 % IJ SOLN: 3.0000 mL | INTRAMUSCULAR | Status: DC | PRN

## 2015-03-19 MED ORDER — OXYTOCIN 10 UNIT/ML IJ SOLN
INTRAMUSCULAR | Status: AC
  Filled 2015-03-19: qty 4

## 2015-03-19 MED ORDER — BUPIVACAINE HCL (PF) 0.25 % IJ SOLN
INTRAMUSCULAR | Status: AC
  Filled 2015-03-19: qty 20

## 2015-03-19 MED ORDER — FENTANYL CITRATE (PF) 100 MCG/2ML IJ SOLN: 25.0000 ug | INTRAMUSCULAR | Status: DC | PRN

## 2015-03-19 MED ORDER — OXYTOCIN 10 UNIT/ML IJ SOLN
40.0000 [IU] | INTRAMUSCULAR | Status: DC | PRN
  Administered 2015-03-19: 40 [IU] via INTRAVENOUS

## 2015-03-19 MED ORDER — IBUPROFEN 600 MG PO TABS
600.0000 mg | ORAL_TABLET | Freq: Four times a day (QID) | ORAL | Status: DC
  Administered 2015-03-19 – 2015-03-22 (×11): 600 mg via ORAL
  Filled 2015-03-19 (×11): qty 1

## 2015-03-19 MED ORDER — ONDANSETRON HCL 4 MG/2ML IJ SOLN: 4.0000 mg | Freq: Once | INTRAMUSCULAR | Status: DC | PRN

## 2015-03-19 MED ORDER — LACTATED RINGERS IV SOLN
INTRAVENOUS | Status: DC
  Administered 2015-03-19 – 2015-03-20 (×2): via INTRAVENOUS

## 2015-03-19 MED ORDER — SCOPOLAMINE 1 MG/3DAYS TD PT72
1.0000 | MEDICATED_PATCH | Freq: Once | TRANSDERMAL | Status: DC
  Filled 2015-03-19: qty 1

## 2015-03-19 MED ORDER — DIBUCAINE 1 % RE OINT: 1.0000 "application " | TOPICAL_OINTMENT | RECTAL | Status: DC | PRN

## 2015-03-19 MED ORDER — ONDANSETRON HCL 4 MG/2ML IJ SOLN
INTRAMUSCULAR | Status: AC
  Filled 2015-03-19: qty 2

## 2015-03-19 MED ORDER — SODIUM BICARBONATE 8.4 % IV SOLN
INTRAVENOUS | Status: DC | PRN
  Administered 2015-03-19: 5 mL via EPIDURAL
  Administered 2015-03-19: 2.5 mL via EPIDURAL
  Administered 2015-03-19: 5 mL via EPIDURAL
  Administered 2015-03-19 (×2): 2.5 mL via EPIDURAL
  Administered 2015-03-19: 5 mL via EPIDURAL
  Administered 2015-03-19: 2.5 mL via EPIDURAL

## 2015-03-19 MED ORDER — PRENATAL MULTIVITAMIN CH: 1.0000 | ORAL_TABLET | Freq: Every day | ORAL | Status: DC

## 2015-03-19 MED ORDER — BUPIVACAINE-EPINEPHRINE (PF) 0.25% -1:200000 IJ SOLN
INTRAMUSCULAR | Status: AC
  Filled 2015-03-19: qty 30

## 2015-03-19 MED ORDER — FENTANYL CITRATE (PF) 100 MCG/2ML IJ SOLN
INTRAMUSCULAR | Status: DC | PRN
  Administered 2015-03-19 (×4): 50 ug via INTRAVENOUS

## 2015-03-19 MED ORDER — SIMETHICONE 80 MG PO CHEW
80.0000 mg | CHEWABLE_TABLET | ORAL | Status: DC
  Administered 2015-03-20 – 2015-03-22 (×2): 80 mg via ORAL
  Filled 2015-03-19 (×3): qty 1

## 2015-03-19 MED ORDER — NALBUPHINE HCL 10 MG/ML IJ SOLN: 5.0000 mg | Freq: Once | INTRAMUSCULAR | Status: AC | PRN

## 2015-03-19 MED ORDER — FENTANYL 2.5 MCG/ML BUPIVACAINE 1/10 % EPIDURAL INFUSION (WH - ANES): INTRAMUSCULAR | Status: DC | PRN

## 2015-03-19 MED ORDER — 0.9 % SODIUM CHLORIDE (POUR BTL) OPTIME
TOPICAL | Status: DC | PRN
  Administered 2015-03-19: 100 mL
  Administered 2015-03-19: 200 mL

## 2015-03-19 MED ORDER — NALOXONE HCL 0.4 MG/ML IJ SOLN: 0.4000 mg | INTRAMUSCULAR | Status: DC | PRN

## 2015-03-19 MED ORDER — SCOPOLAMINE 1 MG/3DAYS TD PT72: 1.0000 | MEDICATED_PATCH | Freq: Once | TRANSDERMAL | Status: DC

## 2015-03-19 MED ORDER — MORPHINE SULFATE (PF) 0.5 MG/ML IJ SOLN
INTRAMUSCULAR | Status: DC | PRN
  Administered 2015-03-19: 4 mg via EPIDURAL
  Administered 2015-03-19: 1 mg via INTRAVENOUS

## 2015-03-19 MED ORDER — FERROUS FUMARATE 325 (106 FE) MG PO TABS
1.0000 | ORAL_TABLET | Freq: Two times a day (BID) | ORAL | Status: DC
  Administered 2015-03-19 – 2015-03-22 (×6): 106 mg via ORAL
  Filled 2015-03-19 (×6): qty 1

## 2015-03-19 MED ORDER — OXYTOCIN 40 UNITS IN LACTATED RINGERS INFUSION - SIMPLE MED: 62.5000 mL/h | INTRAVENOUS | Status: AC

## 2015-03-19 MED ORDER — SIMETHICONE 80 MG PO CHEW
80.0000 mg | CHEWABLE_TABLET | Freq: Three times a day (TID) | ORAL | Status: DC
  Administered 2015-03-19 – 2015-03-22 (×8): 80 mg via ORAL
  Filled 2015-03-19 (×8): qty 1

## 2015-03-19 MED ORDER — BUPIVACAINE LIPOSOME 1.3 % IJ SUSP
20.0000 mL | Freq: Once | INTRAMUSCULAR | Status: DC
  Filled 2015-03-19: qty 20

## 2015-03-19 MED ORDER — MENTHOL 3 MG MT LOZG: 1.0000 | LOZENGE | OROMUCOSAL | Status: DC | PRN

## 2015-03-19 MED ORDER — ZOLPIDEM TARTRATE 5 MG PO TABS: 5.0000 mg | ORAL_TABLET | Freq: Every evening | ORAL | Status: DC | PRN

## 2015-03-19 MED ORDER — LIDOCAINE-EPINEPHRINE (PF) 2 %-1:200000 IJ SOLN
INTRAMUSCULAR | Status: AC
  Filled 2015-03-19: qty 20

## 2015-03-19 MED ORDER — OXYCODONE-ACETAMINOPHEN 5-325 MG PO TABS
2.0000 | ORAL_TABLET | ORAL | Status: DC | PRN
  Administered 2015-03-20 – 2015-03-22 (×5): 2 via ORAL
  Filled 2015-03-19 (×5): qty 2

## 2015-03-19 MED ORDER — KETOROLAC TROMETHAMINE 30 MG/ML IJ SOLN: 30.0000 mg | Freq: Four times a day (QID) | INTRAMUSCULAR | Status: DC | PRN

## 2015-03-19 MED ORDER — PHENYLEPHRINE 40 MCG/ML (10ML) SYRINGE FOR IV PUSH (FOR BLOOD PRESSURE SUPPORT)
PREFILLED_SYRINGE | INTRAVENOUS | Status: AC
  Filled 2015-03-19: qty 10

## 2015-03-19 MED ORDER — LANOLIN HYDROUS EX OINT: 1.0000 "application " | TOPICAL_OINTMENT | CUTANEOUS | Status: DC | PRN

## 2015-03-19 MED ORDER — TERBUTALINE SULFATE 1 MG/ML IJ SOLN
INTRAMUSCULAR | Status: AC
  Filled 2015-03-19: qty 1

## 2015-03-19 MED ORDER — ALBUMIN HUMAN 5 % IV SOLN
INTRAVENOUS | Status: DC | PRN
  Administered 2015-03-19: 15:00:00 via INTRAVENOUS

## 2015-03-19 MED ORDER — CEFAZOLIN SODIUM-DEXTROSE 2-3 GM-% IV SOLR
INTRAVENOUS | Status: AC
  Filled 2015-03-19: qty 50

## 2015-03-19 MED ORDER — WITCH HAZEL-GLYCERIN EX PADS: 1.0000 "application " | MEDICATED_PAD | CUTANEOUS | Status: DC | PRN

## 2015-03-19 MED ORDER — BUPIVACAINE HCL (PF) 0.25 % IJ SOLN
INTRAMUSCULAR | Status: AC
  Filled 2015-03-19: qty 10

## 2015-03-19 MED ORDER — OXYCODONE-ACETAMINOPHEN 5-325 MG PO TABS
1.0000 | ORAL_TABLET | ORAL | Status: DC | PRN
  Administered 2015-03-22: 1 via ORAL
  Filled 2015-03-19: qty 1

## 2015-03-19 MED ORDER — PRENATAL MULTIVITAMIN CH
1.0000 | ORAL_TABLET | Freq: Every day | ORAL | Status: DC
Start: 2015-03-20 — End: 2015-03-22
  Administered 2015-03-20 – 2015-03-21 (×2): 1 via ORAL
  Filled 2015-03-19 (×2): qty 1

## 2015-03-19 MED ORDER — BUPIVACAINE HCL (PF) 0.25 % IJ SOLN
INTRAMUSCULAR | Status: DC | PRN
  Administered 2015-03-19: 20 mL

## 2015-03-19 SURGICAL SUPPLY — 38 items
BENZOIN TINCTURE PRP APPL 2/3 (GAUZE/BANDAGES/DRESSINGS) ×3 IMPLANT
CATH ROBINSON RED A/P 16FR (CATHETERS) IMPLANT
CLAMP CORD UMBIL (MISCELLANEOUS) IMPLANT
CLOSURE WOUND 1/2 X4 (GAUZE/BANDAGES/DRESSINGS) ×1
CLOTH BEACON ORANGE TIMEOUT ST (SAFETY) ×3 IMPLANT
DRAPE SHEET LG 3/4 BI-LAMINATE (DRAPES) IMPLANT
DRSG OPSITE POSTOP 4X10 (GAUZE/BANDAGES/DRESSINGS) ×3 IMPLANT
DURAPREP 26ML APPLICATOR (WOUND CARE) ×3 IMPLANT
ELECT REM PT RETURN 9FT ADLT (ELECTROSURGICAL) ×3
ELECTRODE REM PT RTRN 9FT ADLT (ELECTROSURGICAL) ×1 IMPLANT
EXTRACTOR VACUUM M CUP 4 TUBE (SUCTIONS) IMPLANT
EXTRACTOR VACUUM M CUP 4' TUBE (SUCTIONS)
GAUZE SPONGE 4X4 12PLY STRL (GAUZE/BANDAGES/DRESSINGS) ×3 IMPLANT
GLOVE BIOGEL PI IND STRL 7.5 (GLOVE) ×2 IMPLANT
GLOVE BIOGEL PI INDICATOR 7.5 (GLOVE) ×4
GLOVE ECLIPSE 7.5 STRL STRAW (GLOVE) ×3 IMPLANT
GOWN STRL REUS W/TWL LRG LVL3 (GOWN DISPOSABLE) ×9 IMPLANT
HEMOSTAT SURGICEL 4X8 (HEMOSTASIS) ×3 IMPLANT
KIT ABG SYR 3ML LUER SLIP (SYRINGE) IMPLANT
NEEDLE HYPO 22GX1.5 SAFETY (NEEDLE) ×3 IMPLANT
NEEDLE HYPO 25X5/8 SAFETYGLIDE (NEEDLE) IMPLANT
NS IRRIG 1000ML POUR BTL (IV SOLUTION) ×3 IMPLANT
PACK C SECTION WH (CUSTOM PROCEDURE TRAY) ×3 IMPLANT
PAD OB MATERNITY 4.3X12.25 (PERSONAL CARE ITEMS) ×3 IMPLANT
RTRCTR C-SECT PINK 25CM LRG (MISCELLANEOUS) IMPLANT
STRIP CLOSURE SKIN 1/2X4 (GAUZE/BANDAGES/DRESSINGS) ×2 IMPLANT
SUT MNCRL 0 VIOLET CTX 36 (SUTURE) IMPLANT
SUT MON AB-0 CT1 36 (SUTURE) ×3 IMPLANT
SUT MONOCRYL 0 CTX 36 (SUTURE)
SUT VIC AB 0 CTX 36 (SUTURE) ×8
SUT VIC AB 0 CTX36XBRD ANBCTRL (SUTURE) ×4 IMPLANT
SUT VIC AB 2-0 CT1 27 (SUTURE) ×2
SUT VIC AB 2-0 CT1 TAPERPNT 27 (SUTURE) ×1 IMPLANT
SUT VIC AB 4-0 KS 27 (SUTURE) ×3 IMPLANT
SYR 30ML LL (SYRINGE) ×3 IMPLANT
TAPE CLOTH SURG 4X10 WHT LF (GAUZE/BANDAGES/DRESSINGS) ×3 IMPLANT
TOWEL OR 17X24 6PK STRL BLUE (TOWEL DISPOSABLE) ×3 IMPLANT
TRAY FOLEY CATH SILVER 14FR (SET/KITS/TRAYS/PACK) ×3 IMPLANT

## 2015-03-19 NOTE — Consult Note (Signed)
Neonatology Note:   Attendance at C-section:    I was asked by Dr. Adrian Blackwater to attend this primary C/S at term due to FTP and [facility]. The mother is a G2P0A1 O pos, GBS neg with cervical shortening and insufficiency in the 2nd trimester. ROM 38 hours prior to delivery, fluid with meconium. Mother was afebrile during labor and did not receive antenatal antibiotics. She also did not get any narcotic analgesics other than via epidural. Infant was delivered OP and was floppy at birth and had a small amount of dark green meconium in the throat. We suctioned, then gave vigorous stimulation, but her HR was about 60, so PPV was started. PPV continued for about 1.5 min, with some resp effort noted and increase in HR to normal; PPV stopped and stimulation given, but baby again became apneic. Bulb suctioned for scant green fluid, applied PPV for another 1-2 minutes, HR gradually rising back to normal. Again, infant unable to maintain sufficient respiratory effort to keep HR normal. Intubated infant atraumatically on the first attempt with a 4.0 mm ETT at 6 minutes of life. CO2 detector turned yellow immediately, equal breath sounds heard. Tube secured. O2 saturations remained in the mid-80s despite bagging with 100% O2, but baby had good tone and was breathing spontaneously by 10 minutes and had good perfusion. Seen by both parents in the DR, then transported to the NICU for further care, with her father in attendance. Ap 1/5/8.    Doretha Sou, MD

## 2015-03-19 NOTE — Progress Notes (Signed)
   Morgan Conrad is a 24 y.o. G2P0010 at [redacted]w[redacted]d  admitted for PROM  Subjective:  Comfortable with epidural Objective: Filed Vitals:   03/18/15 2230 03/18/15 2300 03/19/15 0000 03/19/15 0030  BP: 122/74 125/69 106/85 96/63  Pulse: 82 93 107 80  Temp:      TempSrc:      Resp: 18 18 18 18   Height:      Weight:      SpO2: 99% 100%     Total I/O In: -  Out: 2000 [Urine:2000]  FHT:  FHR: 145 bpm, variability: moderate,  accelerations:  Abscent,  decelerations:  Present had about 10 minutes of late decels, responded to position change.  Also had 3 prolonged variable decels.  No decels and good variability for >1 hour. UC:   irregular, every 2-3.5 minutess.  MVU;s 160 SVE:   Dilation: 4 Effacement (%): 100 Station: -2 Exam by:: [email or web address], RN  Pitocin @ 2 mu/min  Labs: Lab Results  Component Value Date   WBC 11.5* 03/18/2015   HGB 12.0 03/18/2015   HCT 33.5* 03/18/2015   MCV 83.1 03/18/2015   PLT 321 03/18/2015    Assessment / Plan: Protracted latent phase; will continue trying to increase pitocin to achieve adequate labor, while at the same time paying careful attention to minimizing stress on the baby  Labor: inadequate Fetal Wellbeing:  Category I and Category II Pain Control:  Epidural Anticipated MOD:  NSVD  Morgan Conrad 03/19/2015, 1:15 AM

## 2015-03-19 NOTE — Progress Notes (Signed)
   Morgan Conrad is a 24 y.o. G2P0010 at [redacted]w[redacted]d  admitted for PROM  Subjective: Comfortable with epidural  Objective: Filed Vitals:   03/19/15 0200 03/19/15 0230 03/19/15 0300 03/19/15 0330  BP: 105/49 110/56 112/63 110/58  Pulse: 74 73 79 86  Temp:      TempSrc:      Resp: 16 16 16 16   Height:      Weight:      SpO2:       Total I/O In: -  Out: 2800 [Urine:2800]  FHT:  FHR: 140 bpm, variability: minimal to avg,  accelerations:  Abscent,  decelerations:  Present Had a 10 minute run of deep late decels during tachysystole when pitocin was increased from 57mu/min to 4 mu/min.  Responded to 02, IVF bolus, which also spaced out ctx.  Now FHR 140's, minimal variability, no decels, no accels with ctx q 4 minutes UC:   irregular, every 1-4 minutes  SVE:   Dilation: 5.5 Effacement (%): 60 Station: -1 Exam by:: Cresenzo-Dishmon, CNM  Pitocin @ 4 mu/min Cervix is beginning to get edematous, but there is a large amount of difference in the dilation and the vtx has descended to -1.  Labs: Lab Results  Component Value Date   WBC 11.5* 03/18/2015   HGB 12.0 03/18/2015   HCT 33.5* 03/18/2015   MCV 83.1 03/18/2015   PLT 321 03/18/2015    Assessment / Plan: Augmentation of labor, progressing well in the last hour.  Will continue, not planning on increasing pitocin unless the FHR stays Cat 1  Labor: proressing now Fetal Wellbeing:  Category I and Category II Pain Control:  Epidural Anticipated MOD:  NSVD  CRESENZO-DISHMAN,Morgan Conrad 03/19/2015, 4:21 AM

## 2015-03-19 NOTE — Transfer of Care (Signed)
Immediate Anesthesia Transfer of Care Note  Patient: Morgan Conrad  Procedure(s) Performed: Procedure(s): CESAREAN SECTION (N/A)  Patient Location: PACU  Anesthesia Type:Epidural  Level of Consciousness: sedated  Airway & Oxygen Therapy: Patient Spontanous Breathing  Post-op Assessment: Report given to RN  Post vital signs: Reviewed and stable  Last Vitals:  Filed Vitals:   03/19/15 1355  BP: 125/108  Pulse: 123  Temp:   Resp:     Complications: No apparent anesthesia complications

## 2015-03-19 NOTE — Anesthesia Postprocedure Evaluation (Signed)
  Anesthesia Post-op Note  Patient: Morgan Conrad  Procedure(s) Performed: Procedure(s) (LRB): CESAREAN SECTION (N/A)  Patient Location: PACU  Anesthesia Type: Epidural  Level of Consciousness: awake and alert   Airway and Oxygen Therapy: Patient Spontanous Breathing  Post-op Pain: mild  Post-op Assessment: Post-op Vital signs reviewed, Patient's Cardiovascular Status Stable, Respiratory Function Stable, Patent Airway and No signs of Nausea or vomiting  Last Vitals:  Filed Vitals:   03/19/15 1730  BP: 117/65  Pulse: 96  Temp: 37.3 C  Resp: 21    Post-op Vital Signs: stable   Complications: No apparent anesthesia complications

## 2015-03-19 NOTE — Progress Notes (Signed)
Patient seen - has had little progression.  FHT showing late decelerations.  Not able to increase pitocin.  Will take to OR for cesarean section for FTP and non reassuring FHT.  The risks of cesarean section discussed with the patient included but were not limited to: bleeding which may require transfusion or reoperation; infection which may require antibiotics; injury to bowel, bladder, ureters or other surrounding organs; injury to the fetus; need for additional procedures including hysterectomy in the event of a life-threatening hemorrhage; placental abnormalities wth subsequent pregnancies, incisional problems, thromboembolic phenomenon and other postoperative/anesthesia complications. The patient concurred with the proposed plan, giving informed written consent for the procedure.   Patient has been NPO since last night she will remain NPO for procedure. Anesthesia and OR aware.  Preoperative prophylactic Ancef ordered on call to the OR.  To OR when ready.  Morgan Heritage, DO 03/19/2015 1:44 PM

## 2015-03-19 NOTE — Op Note (Signed)
Morgan Conrad PROCEDURE DATE: 03/19/2015  PREOPERATIVE DIAGNOSES: Intrauterine pregnancy at [redacted]w[redacted]d weeks gestation; cephalo-pelvic disproportion, failure to progress: arrest of dilation and nonreassuring fetal heart tones  POSTOPERATIVE DIAGNOSES: The same, left cervical extension, right broad ligament extension  PROCEDURE: Primary Low Transverse Cesarean Section  SURGEON:  Dr. Candelaria Conrad  ASSISTANT:  Dr. Fredirick Lathe, Dr Tinnie Gens  ANESTHESIOLOGIST: Dr. Gentry Roch  INDICATIONS: Morgan Conrad is a 24 y.o. G2P1011 at [redacted]w[redacted]d here for cesarean section secondary to the indications listed under preoperative diagnoses; please see preoperative note for further details.  Essentially no cervical change since 0730 this morning, requiring 3 pitocin breaks secondary to recurrent late decelerations, intermittently inadequate contraction pattern secondary to pitocin breaks.  The risks of cesarean section were discussed with the patient including but were not limited to: bleeding which may require transfusion or reoperation; infection which may require antibiotics; injury to bowel, bladder, ureters or other surrounding organs; injury to the fetus; need for additional procedures including hysterectomy in the event of a life-threatening hemorrhage; placental abnormalities wth subsequent pregnancies, incisional problems, thromboembolic phenomenon and other postoperative/anesthesia complications.   The patient concurred with the proposed plan, giving informed written consent for the procedure.    FINDINGS:  Viable female infant in cephalic presentation.  Apgars 1 and 5.  Clear amniotic fluid.  Intact placenta, three vessel cord.  Normal uterus, fallopian tubes and ovaries bilaterally.  Umbilical Artery Blood gas: pH 7.341   ANESTHESIA: Epidural INTRAVENOUS FLUIDS: 2400 ml ESTIMATED BLOOD LOSS: 1700 ml URINE OUTPUT:  2375 ml SPECIMENS: Placenta sent to L&D COMPLICATIONS: None immediate  PROCEDURE IN DETAIL:  The  patient preoperatively received intravenous antibiotics and had sequential compression devices applied to her lower extremities.  She was then taken to the operating room where the epidural anesthesia was dosed up to surgical level and was found to be adequate. She was then placed in a dorsal supine position with a leftward tilt, and prepped and draped in a sterile manner.  A foley catheter was placed into her bladder and attached to constant gravity.  After an adequate timeout was performed, a Pfannenstiel skin incision was made with scalpel and carried through to the underlying layer of fascia. The fascia was incised in the midline, and this incision was extended bilaterally using the Mayo scissors.  Kocher clamps were applied to the superior aspect of the fascial incision and the underlying rectus muscles were dissected off bluntly. A similar process was carried out on the inferior aspect of the fascial incision. The rectus muscles were separated in the midline bluntly and the peritoneum was entered bluntly where bladder was noted to protrude through incision secondary to fetal presentation.  Bladder flap created with Metzenbaum.  Alexis inserted for improved visualization.  Attention was turned to the lower uterine segment where a low transverse hysterotomy was made with a scalpel and extended bilaterally bluntly.  The infant was successfully delivered from deep arrest direct occiput posterior presentation, the cord was clamped and cut and the infant was handed over to awaiting neonatology team. Uterine massage was then administered, and the placenta delivered intact with a three-vessel cord. The uterus was then cleared of clot and debris.  Very deep extensions noted bilaterally, anterior aspect of lower uterine segment very difficult to visualize.  The right extension down to broad ligament, left down to cervix.  Dr. Shawnie Pons called to OR to assist with closure of the hysterotomy.  The left extension was closed  with 0 Monocryl and the hysterotomy was  closed with 0 Vicryl in a running locked fashion, and an imbricating layer was also placed with 0 Vicryl. The pelvis was cleared of all clot and debris. Hemostasis was confirmed on all surfaces.  Irrigated with normal saline.  The peritoneum and the muscles were reapproximated using 0 Vicryl interrupted stitches. The fascia was then closed using 0 Vicryl in a running fashion.  The subcutaneous layer was irrigated, and 30 ml of 0.5% Marcaine mixed with Exparel was injected subcutaneously around the incision.  The skin was closed with a 4-0 Vicryl subcuticular stitch. The patient tolerated the procedure well. Sponge, lap, instrument and needle counts were correct x 2.  She was taken to the recovery room in stable condition.   [name], MD OB Fellow Faculty Practice, [facility]    Attestation of Attending Supervision of Obstetric Fellow During Surgery: Surgery was performed by the Obstetric Fellow under my supervision and collaboration.  I have reviewed the Obstetric Fellow's operative report, and I agree with the documentation.  I was present and scrubbed for the entire procedure.    Morgan Celeste, DO Attending Physician Faculty Practice, [facility]

## 2015-03-19 NOTE — Progress Notes (Signed)
   Morgan Conrad is a 24 y.o. G2P0010 at [redacted]w[redacted]d  admitted for PROM  Subjective: C/o rectal pressure with ctx  Objective: Filed Vitals:   03/19/15 0600 03/19/15 0638 03/19/15 0700 03/19/15 0730  BP: 112/51 119/53 101/60   Pulse: 72 72 82   Temp: 98.5 F (36.9 C)   97.9 F (36.6 C)  TempSrc: Axillary   Oral  Resp: 18 18 18    Height:      Weight:      SpO2:       Total I/O In: -  Out: 1000 [Urine:1000]  FHT:  FHR: 130 bpm, variability: minimal to moderate,  accelerations:  Abscent,  decelerations:  Present Had 4 late decels with tachysystole.  resolved now UC:   irregular, every 2-4 minutes SVE:   Dilation: 6 Effacement (%): 70 Station: 0 Exam by:: Sowder RNC Pitocin was at 4 mu/min, turned off with decels  Labs: Lab Results  Component Value Date   WBC 11.5* 03/18/2015   HGB 12.0 03/18/2015   HCT 33.5* 03/18/2015   MCV 83.1 03/18/2015   PLT 321 03/18/2015    Assessment / Plan: Protracted latent phase.  Cx still edematous, caput low.  Discussed with Dr. 03/20/2015.  Will give pt one more chance at SVD if FHR recovers enough to restart pitocin  Labor: stalled but adequate. Fetal Wellbeing:  Category I and Category II Pain Control:  Epidural Anticipated MOD:  NSVD  CRESENZO-DISHMAN,Morgan Conrad 03/19/2015, 8:04 AM

## 2015-03-19 NOTE — Plan of Care (Signed)
Problem: Phase I Progression Outcomes Goal: Pain controlled with appropriate interventions Outcome: Completed/Met Date Met:  03/19/15 Epidural anesthesia is adequate at this time. Goal: Voiding adequately Outcome: Not Applicable Date Met:  82/60/88 Foley to s/d at this time draining qs clear yellow urine. Goal: Foley catheter patent Outcome: Completed/Met Date Met:  03/19/15 Qs clear yellow urine. Goal: OOB as tolerated unless otherwise ordered Outcome: Completed/Met Date Met:  03/19/15 Walked to bathroom for peri care and tolerated well,then went down to NICU in w/c and tolerated well. Goal: IS, TCDB as ordered Outcome: Completed/Met Date Met:  03/19/15 Can get I/S up to 1750.Encouraged to use every time she thinks about it.Non productive cough. Goal: VS, stable, temp < 100.4 degrees F Outcome: Completed/Met Date Met:  03/19/15 Afebrile and VSS stable at this time. Goal: Initial discharge plan identified Outcome: Completed/Met Date Met:  03/19/15 VSS Vaginal bleeding WNL Pain controlled Voiding without difficulty Passing flatus or BM Understands self care and when to call MD Goal: Other Phase I Outcomes/Goals Outcome: Completed/Met Date Met:  03/19/15 Ambulated and tolerated well.

## 2015-03-19 NOTE — Progress Notes (Signed)
Labor Progress Note  S: reported rectal pressure  O:  BP 140/55 mmHg  Pulse 82  Temp(Src) 97.9 F (36.6 C) (Oral)  Resp 22  Ht 5' 2.5" (1.588 m)  Wt 200 lb (90.719 kg)  BMI 35.97 kg/m2  SpO2 100%  LMP 06/15/2014 Cat I CVE: 6/70/-2 with caput down to 0 station  A&P: 24 y.o. G2P0010 [redacted]w[redacted]d admitted with PPPROM since 4/21 midnight # labor: cervical exam with small pelvis and caput extending down 2 stations, no change since 0730.  Pit turned off x 3 2/2 fetal decelerations, most recently at 0800.  Contraction pattern intermittently inadequate.  Discussed nonemergent cesarean section at this time for arrest of dilation and cephalopelvic disproportion, patient agreeable with plan.  Discussed with Dr. Adrian Blackwater, he will repeat cervical exam.  The risks of cesarean section discussed with the patient included but were not limited to: bleeding which may require transfusion or reoperation; infection which may require antibiotics; injury to bowel, bladder, ureters or other surrounding organs; injury to the fetus; need for additional procedures including hysterectomy in the event of a life-threatening hemorrhage; placental abnormalities wth subsequent pregnancies, incisional problems, thromboembolic phenomenon and other postoperative/anesthesia complications. The patient concurred with the proposed plan, giving informed written consent for the procedure.   Patient has been NPO since yesterday 2330 she will remain NPO for procedure. Anesthesia and OR aware. Preoperative prophylactic antibiotics and SCDs ordered on call to the OR.  To OR when ready.   [name], MD 10:56 AM

## 2015-03-19 NOTE — Addendum Note (Signed)
Addendum  created 03/19/15 1915 by Karie Schwalbe, MD   Modules edited: Orders, PRL Based Order Sets

## 2015-03-20 LAB — CBC
HCT: 23.7 % — ABNORMAL LOW (ref 36.0–46.0)
Hemoglobin: 8.4 g/dL — ABNORMAL LOW (ref 12.0–15.0)
MCH: 29.9 pg (ref 26.0–34.0)
MCHC: 35.4 g/dL (ref 30.0–36.0)
MCV: 84.3 fL (ref 78.0–100.0)
Platelets: 223 10*3/uL (ref 150–400)
RBC: 2.81 MIL/uL — AB (ref 3.87–5.11)
RDW: 14.5 % (ref 11.5–15.5)
WBC: 24 10*3/uL — ABNORMAL HIGH (ref 4.0–10.5)

## 2015-03-20 NOTE — Progress Notes (Signed)
Post Partum Day 1 Subjective:  Morgan Conrad is a 24 y.o. G2P1011 [redacted]w[redacted]d s/p pLTCS 2/2 FTP/NRFHT.  No acute events overnight.  Pt denies problems with ambulating, voiding or po intake.  She denies nausea or vomiting.  Pain is well controlled.  She has had flatus. She has had bowel movement.  Lochia Minimal.     Objective: Blood pressure 114/66, pulse 81, temperature 98 F (36.7 C), temperature source Oral, resp. rate 18, height 5' 2.5" (1.588 m), weight 200 lb (90.719 kg), last menstrual period 06/15/2014, SpO2 97 %, unknown if currently breastfeeding.  Physical Exam:  General: alert, cooperative and no distress Lochia:normal flow Chest: CTAB Heart: RRR no m/r/g Abdomen: +BS, soft, nontender,  Uterine Fundus: firm DVT Evaluation: No evidence of DVT seen on physical exam. Extremities: mild edema   Recent Labs  03/19/15 1530 03/20/15 0503  HGB 8.2* 8.4*  HCT 23.5* 23.7*    Assessment/Plan:  ASSESSMENT: Morgan Conrad is a 24 y.o. G2P1011 [redacted]w[redacted]d s/p pLTCS  Plan for discharge on POD#3 Baby in NICU   LOS: 2 days   Demetre Monaco ROCIO 03/20/2015, 8:17 AM

## 2015-03-20 NOTE — Anesthesia Postprocedure Evaluation (Signed)
Anesthesia Post Note  Patient: Morgan Conrad  Procedure(s) Performed: Procedure(s) (LRB): CESAREAN SECTION (N/A)  Anesthesia type: Epidural  Patient location: Womens unit  Post pain: Pain level controlled  Post assessment: Post-op Vital signs reviewed  Last Vitals:  Filed Vitals:   03/20/15 0509  BP: 114/66  Pulse: 81  Temp: 36.7 C  Resp: 18    Post vital signs: Reviewed  Level of consciousness:alert  Complications: No apparent anesthesia complications

## 2015-03-20 NOTE — Plan of Care (Signed)
Problem: Discharge Progression Outcomes Goal: Activity appropriate for discharge plan Outcome: Completed/Met Date Met:  03/20/15 Walks frequently to NICU and tolerates well. Goal: Complications resolved/controlled Outcome: Completed/Met Date Met:  03/20/15 Voiding without difficulty.

## 2015-03-20 NOTE — Addendum Note (Signed)
Addendum  created 03/20/15 8527 by Algis Greenhouse, CRNA   Modules edited: Notes Section   Notes Section:  File: [id number]

## 2015-03-21 NOTE — Progress Notes (Signed)
Subjective: Postpartum Day 2: Cesarean Delivery Eating, drinking, voiding, ambulating well.  +flatus.  Lochia and pain wnl.  Denies dizziness, lightheadedness, or sob. No complaints.   Objective: Vital signs in last 24 hours: Temp:  [97.7 F (36.5 C)-98.5 F (36.9 C)] 97.7 F (36.5 C) (04/24 0622) Pulse Rate:  [81-95] 81 (04/24 0622) Resp:  [18] 18 (04/24 0622) BP: (108-121)/(52-77) 117/71 mmHg (04/24 0622) SpO2:  [97 %-100 %] 100 % (04/24 0622)  Physical Exam:  General: alert, cooperative and no distress Lochia: appropriate Uterine Fundus: firm Incision: bulky dressing still intact, to remove today in shower, no s/s infection DVT Evaluation: No evidence of DVT seen on physical exam. Negative Homan's sign. No cords or calf tenderness. No significant calf/ankle edema.   Recent Labs  03/19/15 1530 03/20/15 0503  HGB 8.2* 8.4*  HCT 23.5* 23.7*    Assessment/Plan: Status post Cesarean section. Doing well postoperatively.  Continue current care. Bottlefeeding, plans for nexplanon. Baby in NICU, extubated yesterday.  Plan for d/c tomorrow Continue Fe for anemia  Marge Duncans 03/21/2015, 8:39 AM

## 2015-03-21 NOTE — Plan of Care (Signed)
Problem: Discharge Progression Outcomes Goal: Remove staples per MD order Outcome: Not Applicable Date Met:  77/03/40 Patient does not have staples.Pressure drsg removed,Honeycomb drsg moderate amount of old blood on it and steri strips were wet.no odor noted.New steri strips applied and new honeycomb applied.Incision care discussed with patient and she was able to tell me what she would call the MD for.

## 2015-03-22 ENCOUNTER — Encounter (HOSPITAL_COMMUNITY): Payer: Self-pay | Admitting: Family Medicine

## 2015-03-22 LAB — TYPE AND SCREEN
ABO/RH(D): O POS
Antibody Screen: NEGATIVE
UNIT DIVISION: 0
Unit division: 0

## 2015-03-22 MED ORDER — OXYCODONE-ACETAMINOPHEN 5-325 MG PO TABS: 1.0000 | ORAL_TABLET | ORAL | Status: DC | PRN

## 2015-03-22 MED ORDER — IBUPROFEN 600 MG PO TABS: 600.0000 mg | ORAL_TABLET | Freq: Four times a day (QID) | ORAL | Status: DC

## 2015-03-22 NOTE — Discharge Summary (Signed)
Obstetric Discharge Summary Reason for Admission: onset of labor and cesarean section Prenatal Procedures: ultrasound Intrapartum Procedures: cesarean: low cervical, transverse Postpartum Procedures: none Complications-Operative and Postpartum: none HEMOGLOBIN  Date Value Ref Range Status  03/20/2015 8.4* 12.0 - 15.0 g/dL Final   HCT  Date Value Ref Range Status  03/20/2015 23.7* 36.0 - 46.0 % Final    Physical Exam:  General: alert, cooperative, appears stated age and no distress Lochia: appropriate Uterine Fundus: firm Incision: healing well, no significant drainage, no dehiscence, no significant erythema DVT Evaluation: No evidence of DVT seen on physical exam. Negative Homan's sign. No cords or calf tenderness. No significant calf/ankle edema.  Discharge Diagnoses: Term Pregnancy-delivered  Discharge Information: Date: 03/22/2015 Activity: pelvic rest Diet: routine Medications: PNV, Ibuprofen and Percocet Condition: stable and improved Instructions: refer to practice specific booklet Discharge to: home   Newborn Data: Live born female  Birth Weight:   APGAR: 1, 5  Home with mother.  Wyvonnia Dusky DARLENE 03/22/2015, 8:58 AM

## 2015-03-22 NOTE — Progress Notes (Signed)
Ur chart review completed.  

## 2015-03-22 NOTE — Discharge Summary (Signed)
Obstetric Discharge Summary: Reason for Admission: spontaneous rupture of membranes. Prenatal Procedures: NST and ultrasound Intrapartum Procedures: cesarean: low cervical, transverse Postpartum Procedures: none Complications-Operative and Postpartum: none  Op Note: PROCEDURE DATE: 03/19/2015  PREOPERATIVE DIAGNOSES: Intrauterine pregnancy at [redacted]w[redacted]d weeks gestation; cephalo-pelvic disproportion, failure to progress: arrest of dilation and nonreassuring fetal heart tones  POSTOPERATIVE DIAGNOSES: The same, left cervical extension, right broad ligament extension  PROCEDURE: Primary Low Transverse Cesarean Section  SURGEON: Dr. Candelaria Celeste  ASSISTANT: Dr. Fredirick Lathe, Dr Tinnie Gens  ANESTHESIOLOGIST: Dr. Gentry Roch  INDICATIONS: Morgan Conrad is a 24 y.o. G2P1011 at [redacted]w[redacted]d here for cesarean section secondary to the indications listed under preoperative diagnoses; please see preoperative note for further details. Essentially no cervical change since 0730 this morning, requiring 3 pitocin breaks secondary to recurrent late decelerations, intermittently inadequate contraction pattern secondary to pitocin breaks. The risks of cesarean section were discussed with the patient including but were not limited to: bleeding which may require transfusion or reoperation; infection which may require antibiotics; injury to bowel, bladder, ureters or other surrounding organs; injury to the fetus; need for additional procedures including hysterectomy in the event of a life-threatening hemorrhage; placental abnormalities wth subsequent pregnancies, incisional problems, thromboembolic phenomenon and other postoperative/anesthesia complications. The patient concurred with the proposed plan, giving informed written consent for the procedure.   FINDINGS: Viable female infant in cephalic presentation. Apgars 1 and 5. Clear amniotic fluid. Intact placenta, three vessel cord. Normal uterus, fallopian tubes and  ovaries bilaterally. Umbilical Artery Blood gas: pH 7.341   ANESTHESIA: Epidural INTRAVENOUS FLUIDS: 2400 ml ESTIMATED BLOOD LOSS: 1700 ml URINE OUTPUT: 2375 ml SPECIMENS: Placenta sent to L&D COMPLICATIONS: None immediate  PROCEDURE IN DETAIL: The patient preoperatively received intravenous antibiotics and had sequential compression devices applied to her lower extremities. She was then taken to the operating room where the epidural anesthesia was dosed up to surgical level and was found to be adequate. She was then placed in a dorsal supine position with a leftward tilt, and prepped and draped in a sterile manner. A foley catheter was placed into her bladder and attached to constant gravity. After an adequate timeout was performed, a Pfannenstiel skin incision was made with scalpel and carried through to the underlying layer of fascia. The fascia was incised in the midline, and this incision was extended bilaterally using the Mayo scissors. Kocher clamps were applied to the superior aspect of the fascial incision and the underlying rectus muscles were dissected off bluntly. A similar process was carried out on the inferior aspect of the fascial incision. The rectus muscles were separated in the midline bluntly and the peritoneum was entered bluntly where bladder was noted to protrude through incision secondary to fetal presentation. Bladder flap created with Metzenbaum. Alexis inserted for improved visualization. Attention was turned to the lower uterine segment where a low transverse hysterotomy was made with a scalpel and extended bilaterally bluntly. The infant was successfully delivered from deep arrest direct occiput posterior presentation, the cord was clamped and cut and the infant was handed over to awaiting neonatology team. Uterine massage was then administered, and the placenta delivered intact with a three-vessel cord. The uterus was then cleared of clot and debris. Very deep  extensions noted bilaterally, anterior aspect of lower uterine segment very difficult to visualize. The right extension down to broad ligament, left down to cervix. Dr. Shawnie Pons called to OR to assist with closure of the hysterotomy. The left extension was closed with 0 Monocryl and the  hysterotomy was closed with 0 Vicryl in a running locked fashion, and an imbricating layer was also placed with 0 Vicryl. The pelvis was cleared of all clot and debris. Hemostasis was confirmed on all surfaces. Irrigated with normal saline. The peritoneum and the muscles were reapproximated using 0 Vicryl interrupted stitches. The fascia was then closed using 0 Vicryl in a running fashion. The subcutaneous layer was irrigated, and 30 ml of 0.5% Marcaine mixed with Exparel was injected subcutaneously around the incision. The skin was closed with a 4-0 Vicryl subcuticular stitch. The patient tolerated the procedure well. Sponge, lap, instrument and needle counts were correct x 2. She was taken to the recovery room in stable condition.   [name], MD OB Fellow Faculty Practice, [facility]    Attestation of Attending Supervision of Obstetric Fellow During Surgery: Surgery was performed by the Obstetric Fellow under my supervision and collaboration. I have reviewed the Obstetric Fellow's operative report, and I agree with the documentation. I was present and scrubbed for the entire procedure.   Candelaria Celeste, DO Attending Physician Faculty Practice, [facility] Course:  Active Problems:   Pregnancy   Morgan Conrad is a 24 y.o. G2P1011 s/p LTCS.  Patient was admitted 03/18/2015.  She has postpartum course that was uncomplicated including no problems with ambulating, PO intake, urination, pain, or bleeding. The pt feels ready to go home and  will be discharged with outpatient follow-up.   Today: No acute events overnight.  Pt denies problems with  ambulating, voiding or po intake.  She denies nausea or vomiting.  Pain is well controlled.  She has had flatus. She has not had a bowel movement.  Lochia Minimal.  Plan for birth control is Nexplanon.  Method of Feeding: Bottle  Physical Exam:  Today's Vitals   03/22/15 0106 03/22/15 0310 03/22/15 0514 03/22/15 0624  BP:   112/64   Pulse:   72   Temp:   97.9 F (36.6 C)   TempSrc:   Oral   Resp:   16   Height:      Weight:      SpO2:   98%   PainSc: 3  Asleep 7  3    General: alert, cooperative and no distress  Cardiovascular: regular rate and rhythm without murmurs Lungs: clear to auscultation without wheezing or rales Abdomen: bowel sounds present; no tenderness to palpation Lochia: appropriate Uterine Fundus: firm Incision: healing well, no significant erythema DVT Evaluation: No cords or calf tenderness. Calf/Ankle edema is present. Distal pulses 2+ bilaterally.  H/H: Lab Results  Component Value Date/Time   HGB 8.4* 03/20/2015 05:03 AM   HCT 23.7* 03/20/2015 05:03 AM    Discharge Diagnoses: Term Pregnancy-delivered  Discharge Information: Date: 03/22/2015 Activity: pelvic rest Diet: routine  Medications: Ibuprofen Breast feeding:  No: Bottle Condition: stable Instructions: refer to handout Discharge to: home      Medication List    ASK your doctor about these medications        omeprazole 20 MG capsule  Commonly known as:  PRILOSEC  Take 1 capsule (20 mg total) by mouth daily. 1 tablet a day     prenatal multivitamin Tabs tablet  Take 1 tablet by mouth daily at 12 noon.         Wandra Mannan, Student-PA 03/22/2015,7:31 AM  i assessed this pt and agree with assessment.

## 2015-03-22 NOTE — Progress Notes (Signed)
Pt verbalizes understanding of d/c instructions, medications, follow up appts, when to seek medical care and belongings policy. Pt has no questions at this time. No IV at this time. Pt has copy of d/c instructions and prescription. Pt leaving floor at this time and going to visit with baby in NICU. Pts SO and family members are at bedside and will be driving her home. Sheryn Bison

## 2015-03-25 ENCOUNTER — Ambulatory Visit (INDEPENDENT_AMBULATORY_CARE_PROVIDER_SITE_OTHER): Admitting: Obstetrics & Gynecology

## 2015-03-25 ENCOUNTER — Encounter: Payer: Self-pay | Admitting: Obstetrics & Gynecology

## 2015-03-25 VITALS — BP 140/86 | HR 64 | Ht 62.5 in | Wt 189.0 lb

## 2015-03-25 DIAGNOSIS — Z9889 Other specified postprocedural states: Secondary | ICD-10-CM

## 2015-03-25 MED ORDER — TRIAMTERENE-HCTZ 37.5-25 MG PO TABS: 1.0000 | ORAL_TABLET | Freq: Every day | ORAL | Status: DC

## 2015-03-25 NOTE — Progress Notes (Signed)
Patient ID: Morgan Conrad, female   DOB: 03-24-1991, 24 y.o.   MRN: [id number] Blood pressure 140/86, pulse 64, height 5' 2.5" (1.588 m), weight 189 lb (85.73 kg), last menstrual period 06/15/2014, not currently breastfeeding.   HPI: Patient returns for routine postoperative follow-up having undergone primary Caesarean section on 03/19/2015.  The patient's immediate postoperative recovery has been unremarkable. Since hospital discharge the patient reports swelling.   Current Outpatient Prescriptions: ibuprofen (ADVIL,MOTRIN) 600 MG tablet, Take 1 tablet (600 mg total) by mouth every 6 (six) hours., Disp: 30 tablet, Rfl: 0 oxyCODONE-acetaminophen (PERCOCET/ROXICET) 5-325 MG per tablet, Take 1 tablet by mouth every 4 (four) hours as needed (for pain scale 4-7)., Disp: 30 tablet, Rfl: 0 Prenatal Vit-Fe Fumarate-FA (PRENATAL MULTIVITAMIN) TABS tablet, Take 1 tablet by mouth daily at 12 noon., Disp: , Rfl:  omeprazole (PRILOSEC) 20 MG capsule, Take 1 capsule (20 mg total) by mouth daily. 1 tablet a day (Patient not taking: Reported on 03/15/2015), Disp: 30 capsule, Rfl: 6 triamterene-hydrochlorothiazide (MAXZIDE-25) 37.5-25 MG per tablet, Take 1 tablet by mouth daily., Disp: 30 tablet, Rfl: 0  No current facility-administered medications for this visit.    Blood pressure 140/86, pulse 64, height 5' 2.5" (1.588 m), weight 189 lb (85.73 kg), last menstrual period 06/15/2014, not currently breastfeeding.  Physical Exam: Incision clean dry intact 3+ edema  Diagnostic Tests: none  Pathology: na  Impression: S/p LTCS  Plan: maxzide  Follow up: 2  weeks recheck BP  Lazaro Arms, MD

## 2015-04-09 ENCOUNTER — Encounter: Payer: Self-pay | Admitting: Obstetrics & Gynecology

## 2015-04-09 ENCOUNTER — Ambulatory Visit (INDEPENDENT_AMBULATORY_CARE_PROVIDER_SITE_OTHER): Admitting: Obstetrics & Gynecology

## 2015-04-09 VITALS — BP 118/80 | HR 84 | Wt 161.0 lb

## 2015-04-09 DIAGNOSIS — I10 Essential (primary) hypertension: Secondary | ICD-10-CM

## 2015-04-09 DIAGNOSIS — Z1389 Encounter for screening for other disorder: Secondary | ICD-10-CM

## 2015-04-09 DIAGNOSIS — O165 Unspecified maternal hypertension, complicating the puerperium: Secondary | ICD-10-CM

## 2015-04-25 NOTE — Progress Notes (Addendum)
Patient ID: Morgan Conrad, female   DOB: 11-15-1991, 24 y.o.   MRN: [id number] Pt comes in for BP recheck  No complaints Much less swelling  Blood pressure 118/80, pulse 84, weight 161 lb (73.029 kg), last menstrual period 04/01/2015, not currently breastfeeding. Much better bp reading  Decrease her maxzide to 1/2 tablet  Follow up in 3 weeks for her pp exam     Face to face time:  10 minutes  Greater than 50% of the visit time was spent in counseling and coordination of care with the patient.  The summary and outline of the counseling and care coordination is summarized in the note above.   All questions were answered.

## 2015-04-29 ENCOUNTER — Encounter: Payer: Self-pay | Admitting: Advanced Practice Midwife

## 2015-04-29 ENCOUNTER — Ambulatory Visit (INDEPENDENT_AMBULATORY_CARE_PROVIDER_SITE_OTHER): Admitting: Advanced Practice Midwife

## 2015-04-29 NOTE — Progress Notes (Signed)
  Morgan Conrad is a 24 y.o. who presents for a postpartum visit. She is 6 weeks postpartum following a low cervical transverse Cesarean section. I have fully reviewed the prenatal and intrapartum course. The delivery was at 38.2  gestational weeks. She had PROM, underwent IOL, and had CS for fetal stress. The baby spent 6 days in the NICU. Anesthesia: epidural. Postpartum course has been complicated by HTN, She was placed on Maxide, now down to 1/2 tablet. Baby's course has been uneventful. Baby is feeding by bottle. Bleeding: no bleeding. Bowel function is normal. Bladder function is normal. Patient is sexually active. Contraception method is none. Postpartum depression screening: negative.   Current outpatient prescriptions:  .  IRON PO, Take by mouth daily., Disp: , Rfl:  .  Prenatal Vit-Fe Fumarate-FA (PRENATAL MULTIVITAMIN) TABS tablet, Take 1 tablet by mouth daily at 12 noon., Disp: , Rfl:  .  triamterene-hydrochlorothiazide (MAXZIDE-25) 37.5-25 MG per tablet, Take 1 tablet by mouth daily. (Patient taking differently: Take by mouth. Takes half a tab once daily), Disp: 30 tablet, Rfl: 0  Review of Systems   Constitutional: Negative for fever and chills Eyes: Negative for visual disturbances Respiratory: Negative for shortness of breath, dyspnea Cardiovascular: Negative for chest pain or palpitations  Gastrointestinal: Negative for vomiting, diarrhea and constipation Genitourinary: Negative for dysuria and urgency Musculoskeletal: Negative for back pain, joint pain, myalgias  Neurological: Negative for dizziness and headaches   Objective:     Filed Vitals:   04/29/15 0951  BP: 110/70  Pulse: 64   General:  alert, cooperative and no distress   Breasts:  negative  Lungs: clear to auscultation bilaterally  Heart:  regular rate and rhythm  Abdomen: Soft, nontender   Vulva:  normal  Vagina: normal vagina  Cervix:  closed  Corpus: Well involuted     Rectal Exam: no hemorrhoids       Assessment:    normla postpartum exam.  Plan:    1. Contraception: Plan nexplanon.  Husband is back in [place] now.  Both are movig to [facility] in Sept.  2. Follow up in:  3 weeks or as needed.

## 2015-04-29 NOTE — Addendum Note (Signed)
Addended by: Lazaro Arms on: 04/29/2015 03:08 PM   Modules accepted: Level of Service

## 2015-05-25 ENCOUNTER — Encounter: Payer: Self-pay | Admitting: Adult Health

## 2015-05-25 ENCOUNTER — Ambulatory Visit (INDEPENDENT_AMBULATORY_CARE_PROVIDER_SITE_OTHER): Admitting: Adult Health

## 2015-05-25 VITALS — BP 124/60 | HR 84 | Ht 62.5 in | Wt 154.5 lb

## 2015-05-25 DIAGNOSIS — Z3202 Encounter for pregnancy test, result negative: Secondary | ICD-10-CM | POA: Diagnosis not present

## 2015-05-25 DIAGNOSIS — Z30018 Encounter for initial prescription of other contraceptives: Secondary | ICD-10-CM | POA: Diagnosis not present

## 2015-05-25 DIAGNOSIS — Z30017 Encounter for initial prescription of implantable subdermal contraceptive: Secondary | ICD-10-CM | POA: Insufficient documentation

## 2015-05-25 HISTORY — DX: Encounter for initial prescription of implantable subdermal contraceptive: Z30.017

## 2015-05-25 LAB — POCT URINE PREGNANCY: PREG TEST UR: NEGATIVE

## 2015-05-25 NOTE — Progress Notes (Signed)
Subjective:     Patient ID: Morgan Conrad, female   DOB: 1991-07-19, 24 y.o.   MRN: [id number]  HPI Morgan Conrad is a 24 year old black female in for nexplanon insertion.No complaints. She had a C-section in April, a baby girl.  Review of Systems Patient denies any headaches, hearing loss, fatigue, blurred vision, shortness of breath, chest pain, abdominal pain, problems with bowel movements, urination, or intercourse(not having sex at present). No joint pain or mood swings.  Reviewed past medical,surgical, social and family history. Reviewed medications and allergies.     Objective:   Physical Exam BP 124/60 mmHg  Pulse 84  Ht 5' 2.5" (1.588 m)  Wt 154 lb 8 oz (70.081 kg)  BMI 27.79 kg/m2  LMP 05/23/2015  Breastfeeding? No UPT negative,   Consent signed, time out called. Left arm cleansed with betadine, and injected with 1.5 cc 2% lidocaine and waited til numb. Nexplanon easily inserted and steri strips applied.Rod easily palpated by provider and pt. Pressure dressing applied.    Assessment:     Nexplanon insertion,  lot [id number] exp 9/18    Plan:     Use condoms x 2 weeks, keep clean and dry x 24 hours, no heavy lifting, keep steri strips on x 72 hours, Keep pressure dressing on x 24 hours. Follow up prn problems.   She is moving to [place] in the fall for 3 years, husband in [employer], stationed in [place] at present.

## 2015-05-25 NOTE — Patient Instructions (Signed)
Use condoms x 2 weeks, keep clean and dry x 24 hours, no heavy lifting, keep steri strips on x 72 hours, Keep pressure dressing on x 24 hours. Follow up prn problems.  

## 2015-08-09 IMAGING — US US MFM OB TRANSVAGINAL
1 series · 14 of 20 positions shown · non-contrast
Comparison: none

[Series 1: us mfm ob transvaginal · 0.11mm/px · 20 acquisitions, 14 frames shown]
[im 1/20]
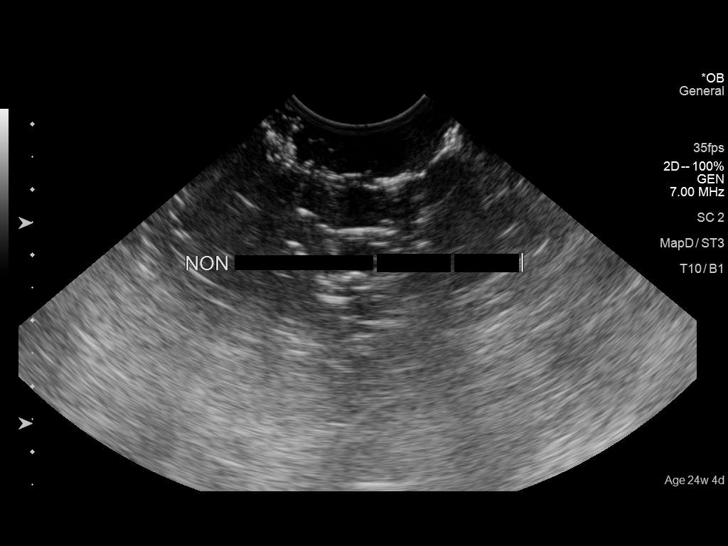
[im 3/20]
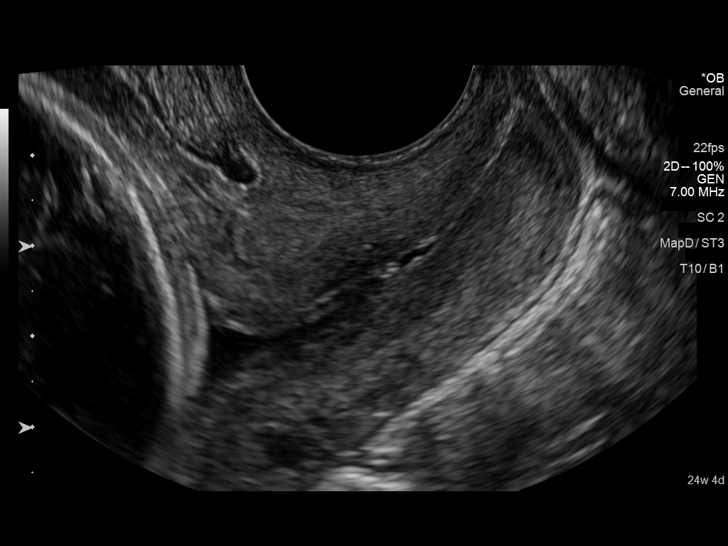
[im 4/20]
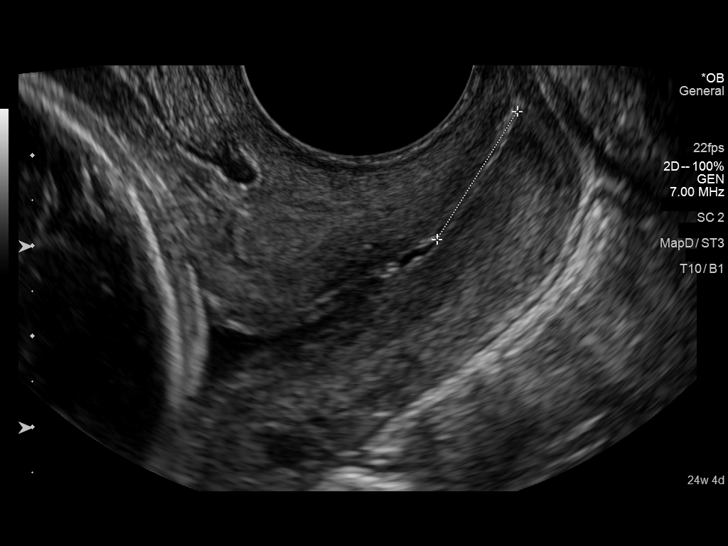
[im 6/20]
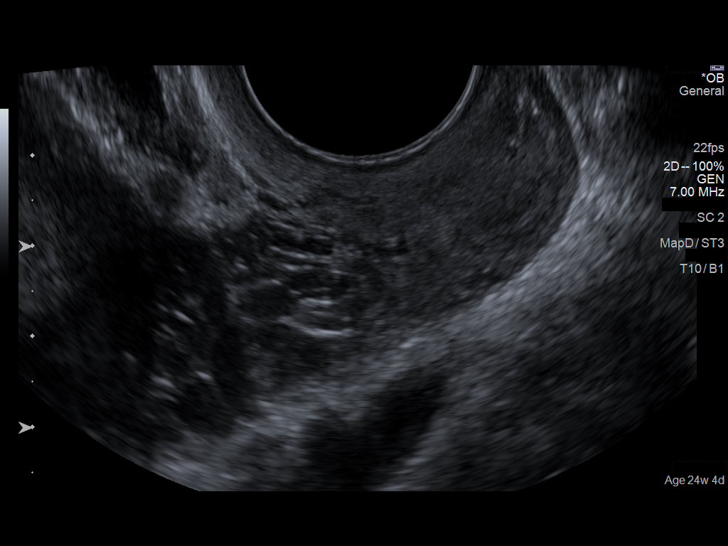
[im 7/20]
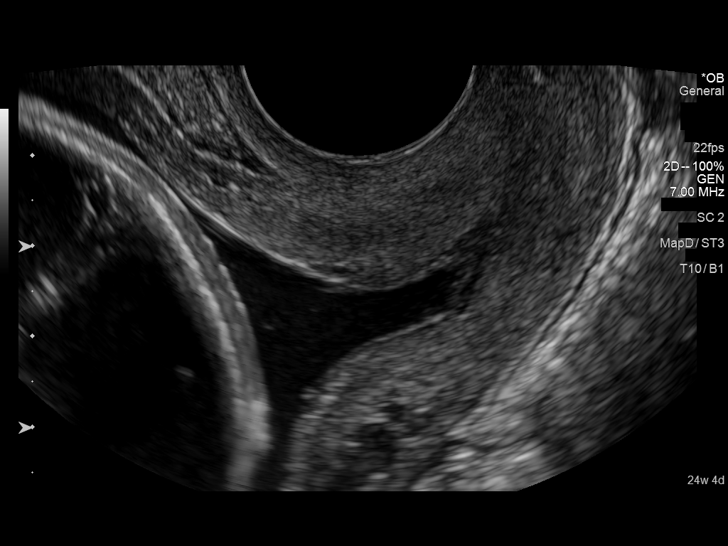
[im 8/20]
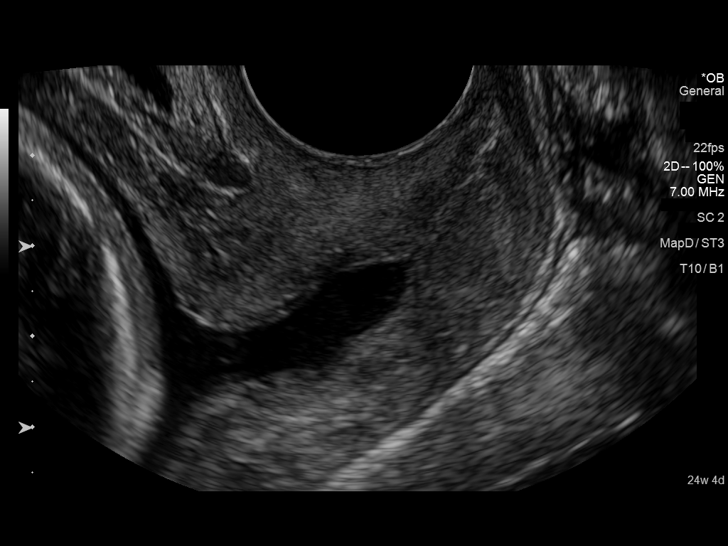
[im 10/20]
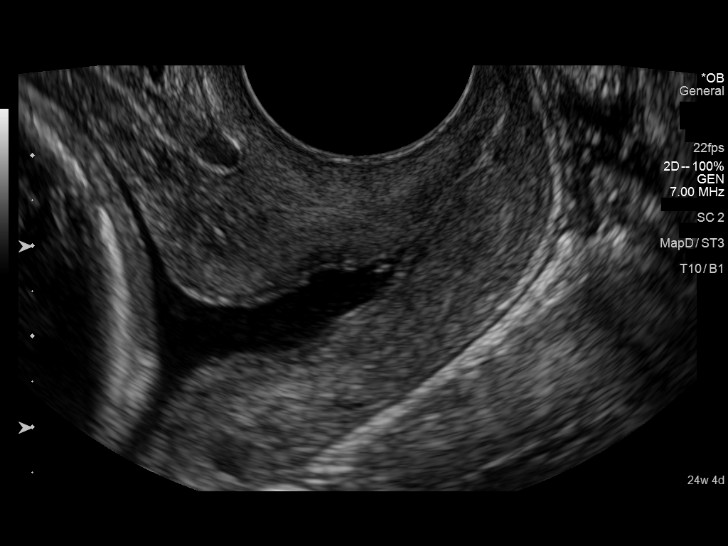
[im 11/20]
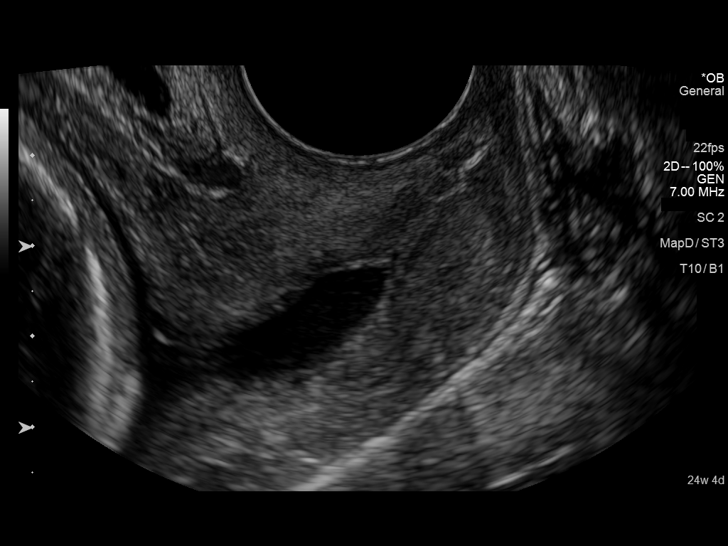
[im 13/20]
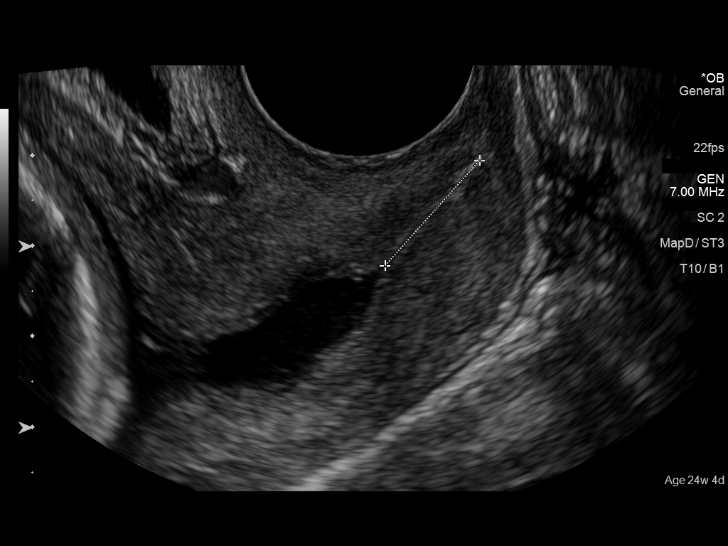
[im 14/20]
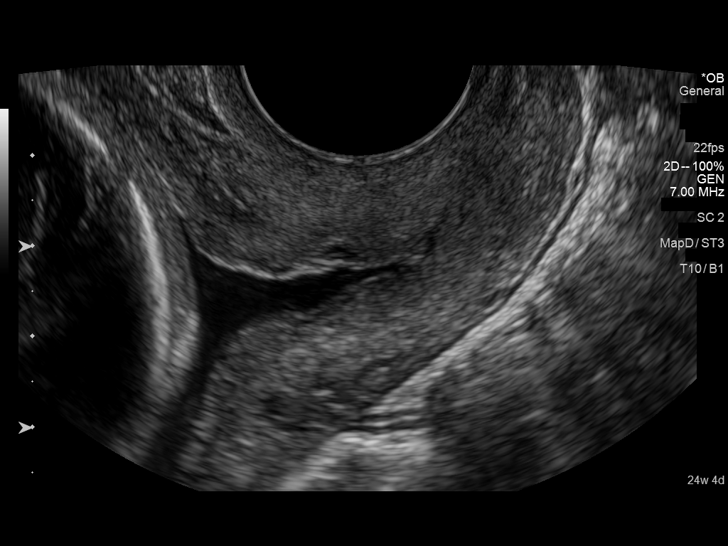
[im 16/20]
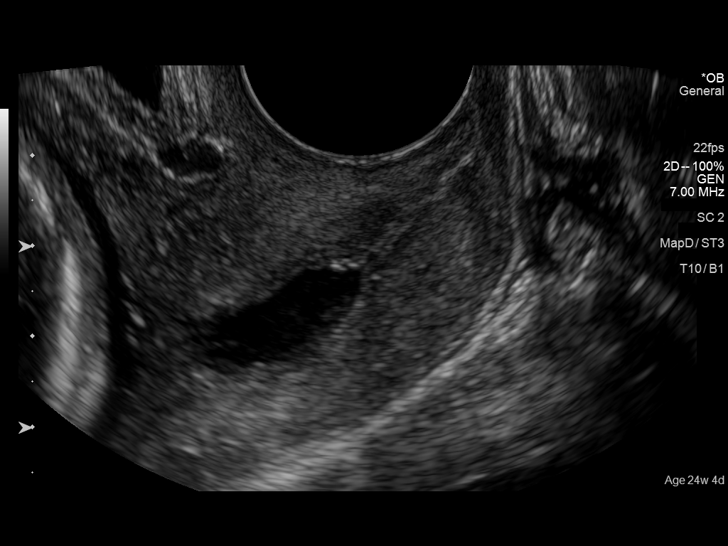
[im 17/20]
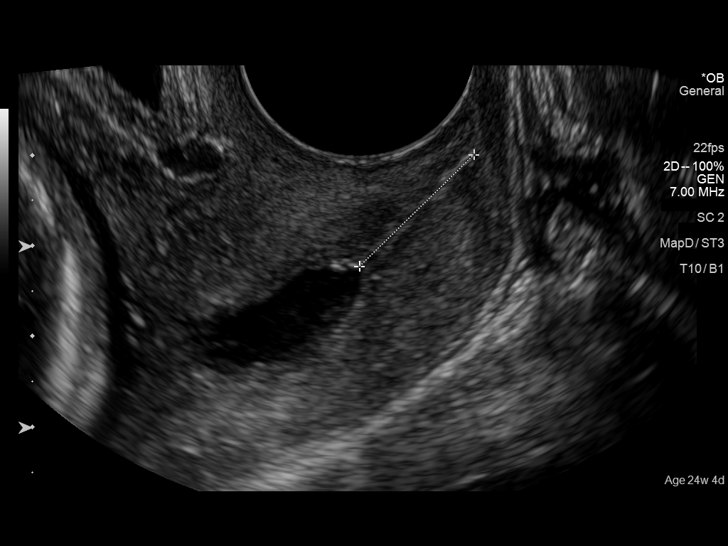
[im 18/20]
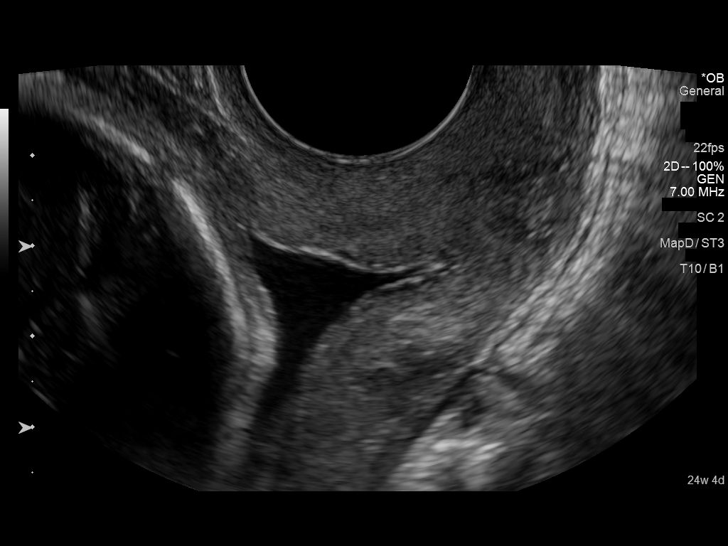
[im 20/20]
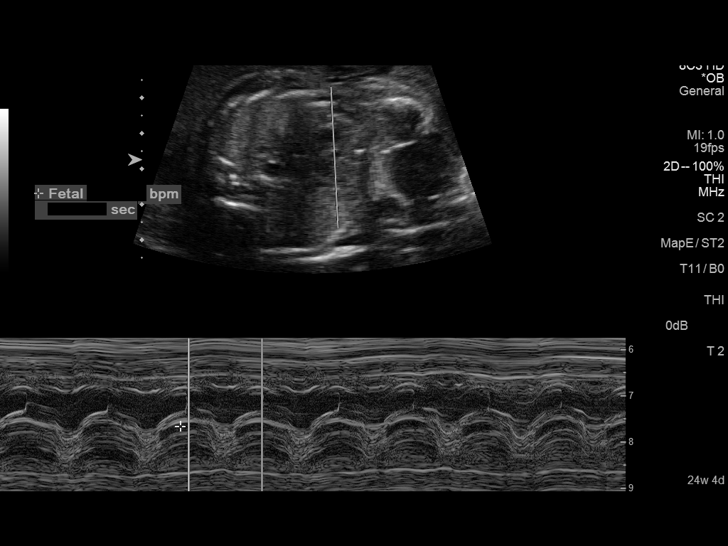

[14 of 20 positions shown; findings below may reference images not displayed]

OBSTETRICS REPORT
                      (Signed Final 12/10/2014 [DATE])

Service(s) Provided

 US MFM OB TRANSVAGINAL                                76817.2
Indications

 24 weeks gestation of pregnancy
 Cervical insufficiency, 2nd
Fetal Evaluation

 Num Of Fetuses:    1
 Fetal Heart Rate:  140                          bpm
 Cardiac Activity:  Observed
 Presentation:      Cephalic
Gestational Age

 LMP:           25w 3d        Date:  06/15/14                 EDD:   03/22/15
 Best:          24w 4d     Det. By:  Early Ultrasound         EDD:   03/28/15
                                     (07/29/14)
Cervix Uterus Adnexa

 Cervical Length:    1.76     cm

 Cervix:       Funneling of internal os noted. Measured
               transvaginally.
Impression

 Single IUP at 24w 4d
 Limited ultrasound performed for cervical length
 Cephalic presentation

 TVUS - cervical length 1.76 cm (some improvement since
 admission).  V-shaped funneling is noted.
Recommendations

 Concur with progesterone supplementation and course of
 Neciosup.
 Recommend follow up ultrasound for cervical length in 1
 week.
 questions or concerns.

## 2015-08-29 IMAGING — US US MFM OB TRANSVAGINAL
1 series · 13 of 28 positions shown · non-contrast
Comparison: none

[Series 1: us mfm ob transvaginal · 0.28mm/px · 38 acquisitions, 13 frames shown]
[im 2/38]
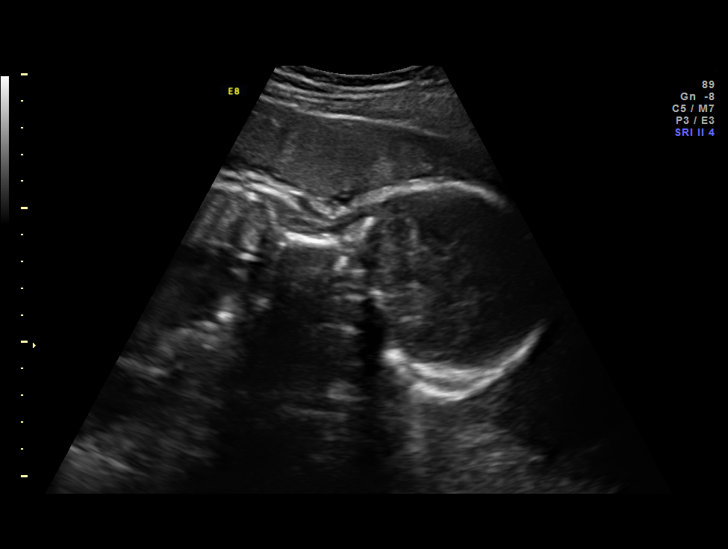
[im 5/38]
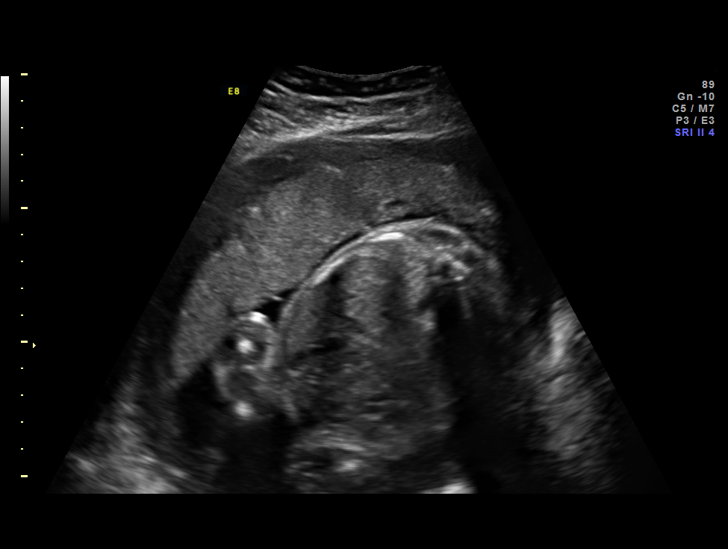
[im 7/38]
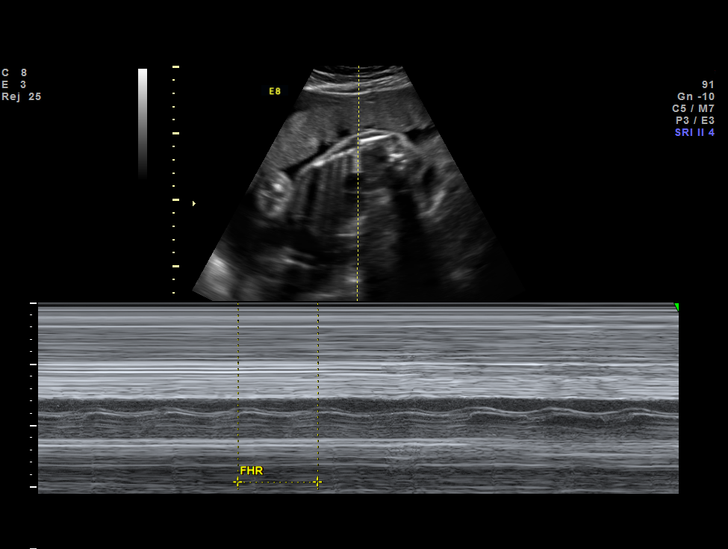
[im 10/38]
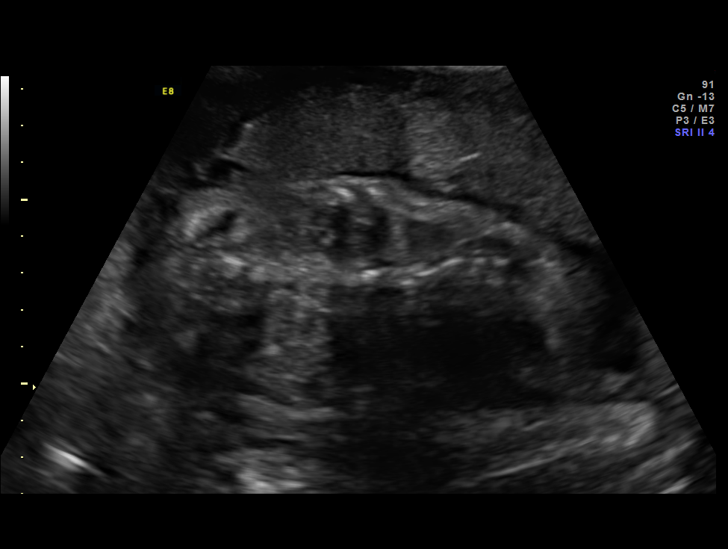
[im 13/38]
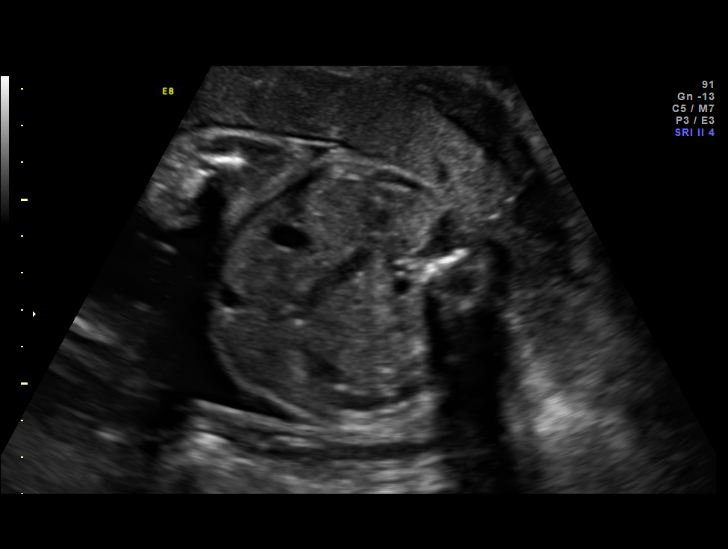
[im 16/38]
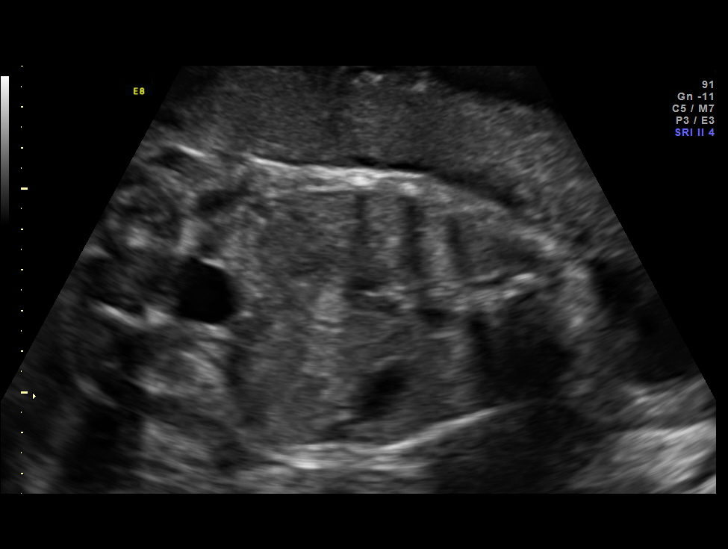
[im 20/38]
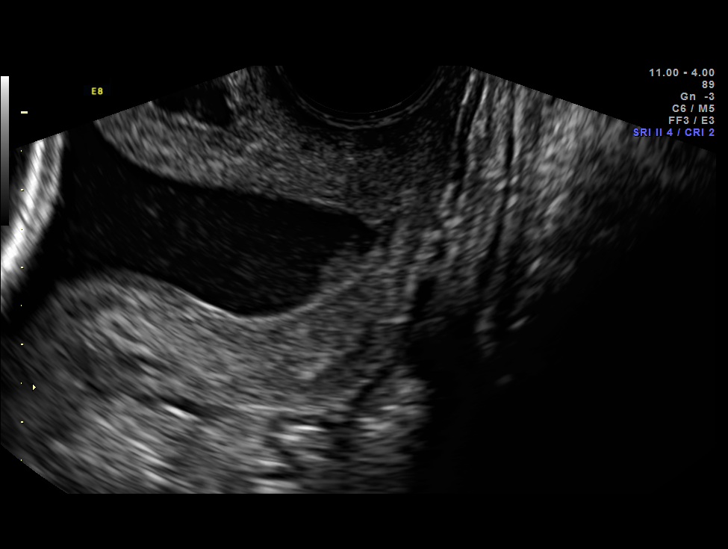
[im 22/38]
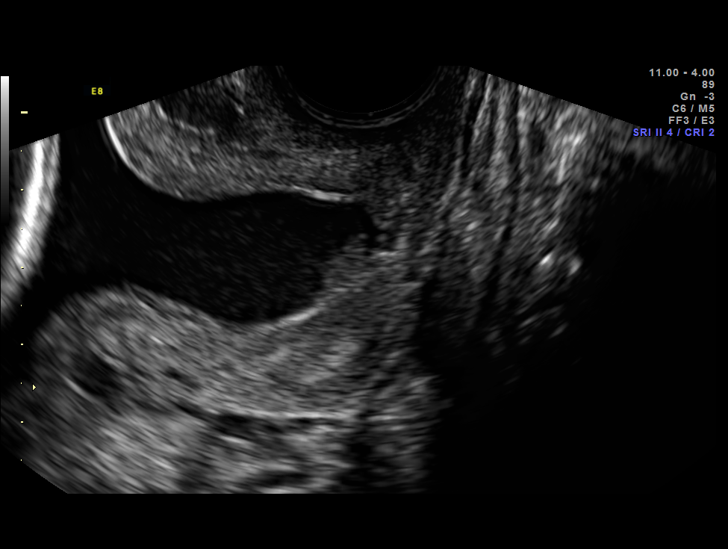
[im 25/38]
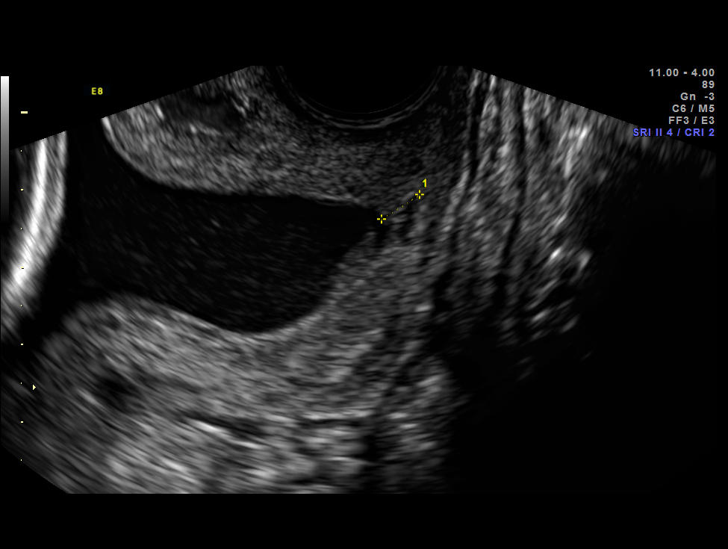
[im 28/38]
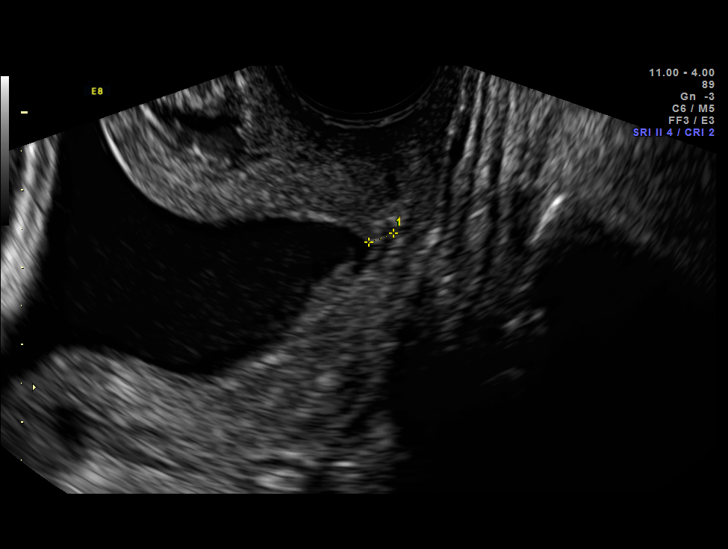
[im 31/38]
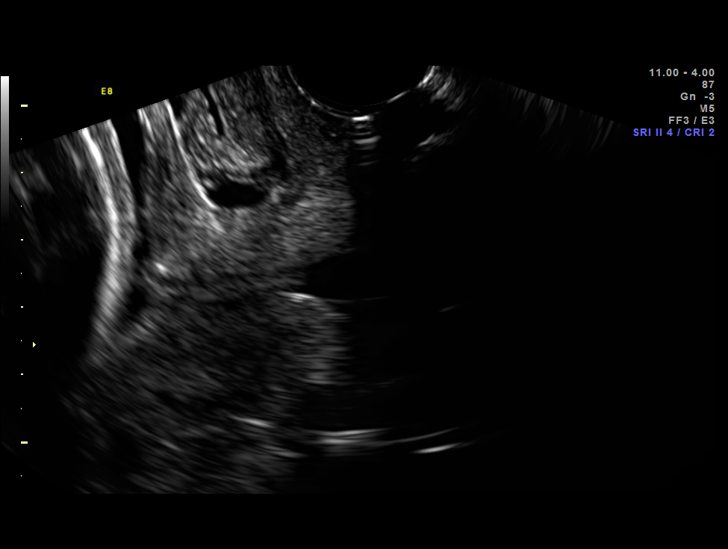
[im 33/38]
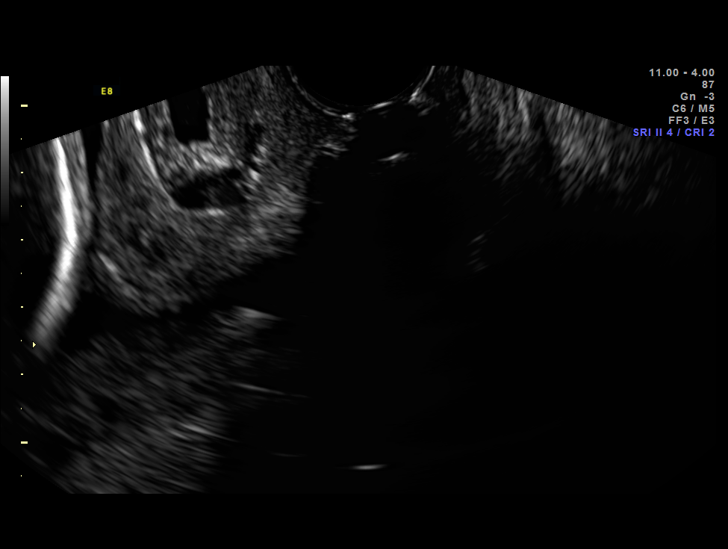
[im 36/38]
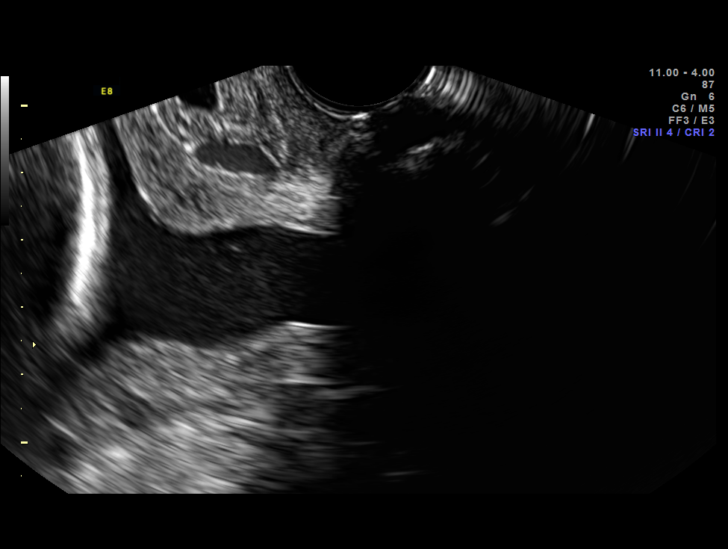

[13 of 28 positions shown; findings below may reference images not displayed]

OBSTETRICS REPORT
                      (Signed Final 12/30/2014 [DATE])

Service(s) Provided

 US MFM OB TRANSVAGINAL                                76817.2
Indications

 Cervical insufficiency, 2nd
 27 weeks gestation of pregnancy
Fetal Evaluation

 Num Of Fetuses:    1
 Fetal Heart Rate:  126                          bpm
 Cardiac Activity:  Observed
 Presentation:      Cephalic
 Placenta:          Anterior, above cervical os
 P. Cord            Marginal insertion
 Insertion:

 Amniotic Fluid
 AFI FV:      Subjectively within normal limits
                                             Larg Pckt:     7.7  cm
Gestational Age

 LMP:           28w 2d        Date:  06/15/14                 EDD:   03/22/15
 Best:          27w 3d     Det. By:  Early Ultrasound         EDD:   03/28/15
                                     (07/29/14)
Cervix Uterus Adnexa

 Cervical Length:    0.4      cm

 Cervix:       Measured transvaginally.
Comments

 Ms. Sangare was seen for evaluation of possible cervical
 pessary.  On [DATE] (24 weeks), a cervical length of 1.5 cm was
 noted with U-shaped funneling.  She completed a course of
 Garrity and was started on vaginal progesterone at
 that time.  Earlier today, she was noted to have a cervimcal
 length of 
 6mm with some cervical debris noted.

 We briefly discussed pessary for shortened cervix.  At least
 on well-designed study showed significant benefit with
 pessary for this indication, but at this point should still be
 considered an experimental therapy.  While there may be no
 significant benefit, feel that the risks of pessary for shortened
 cervix is extremely small.  The patient elected to undergo a
 trial of cervical pessary.
Impression

 Single IUP at 27w 3d
 Shortened cervix - completed course of Garrity,
 currently on vaginal progesterone
 Cephalic presentation

 TVUS - cervical length 4 mm. U-shaped funneling. Cervical
 debris noted.
Recommendations

 Cervical pessary placed without difficulty.
 Continue activity limitations, vaginal progesterone.
 Would remove pessary only if there is a signficant clinical
 change (i.e. rupture of membranes, vaginal bleeding, preterm
 labor) - does not otherwise need to be removed.
 Patient is at high risk for PROM - counseled to come to the
 hospital immediately if this were to occur.
 Recommend follow up next week for cervical length.

 questions or concerns.

## 2015-12-23 ENCOUNTER — Ambulatory Visit (INDEPENDENT_AMBULATORY_CARE_PROVIDER_SITE_OTHER): Payer: Self-pay | Admitting: Advanced Practice Midwife

## 2015-12-23 DIAGNOSIS — N921 Excessive and frequent menstruation with irregular cycle: Secondary | ICD-10-CM

## 2015-12-23 MED ORDER — MEGESTROL ACETATE 40 MG PO TABS: ORAL_TABLET | ORAL | Status: DC

## 2015-12-23 NOTE — Progress Notes (Signed)
  compuiters down--no vitals, exam done.  Pt just having BTB on Nexplanon . Megace algorhithm sent to pharmacy.

## 2016-01-28 IMAGING — US US ABDOMEN LIMITED
1 series · 14 of 25 positions shown · non-contrast
Comparison: None.

CLINICAL DATA: Right upper quadrant abdominal pain for 4 weeks.

EXAM:
US ABDOMEN LIMITED - RIGHT UPPER QUADRANT

[Series 1: us abdomen limited · 0.17mm/px · 14 of 76 slices shown]
[im 1/76]
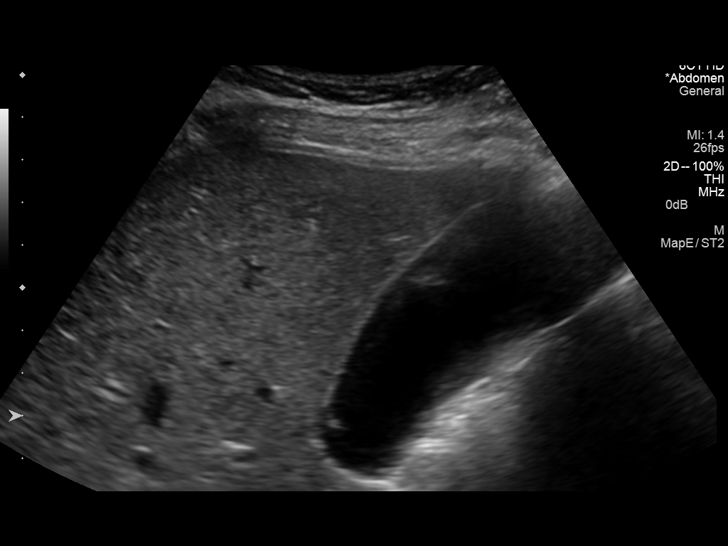
[im 7/76]
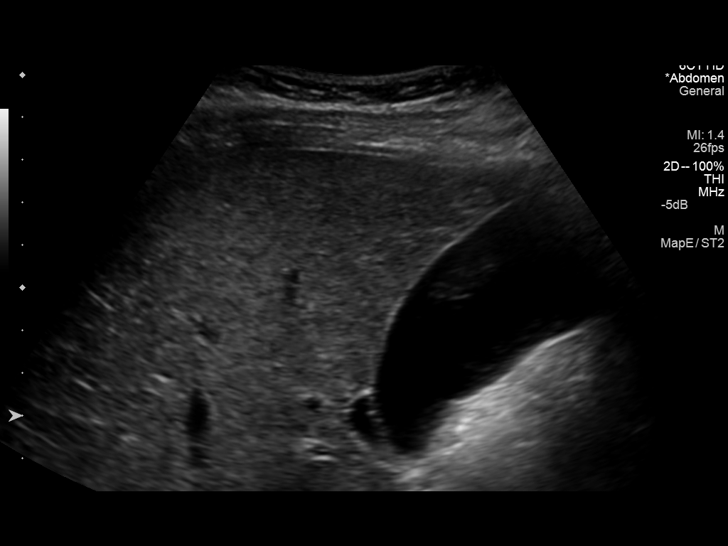
[im 13/76]
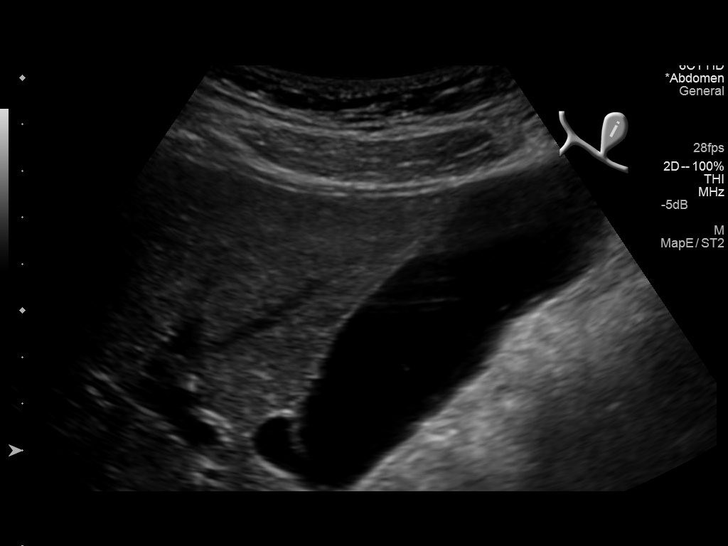
[im 19/76]
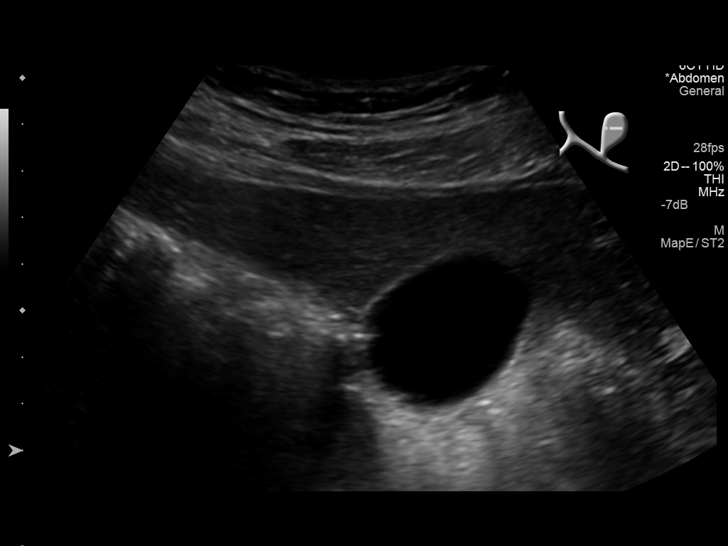
[im 26/76]
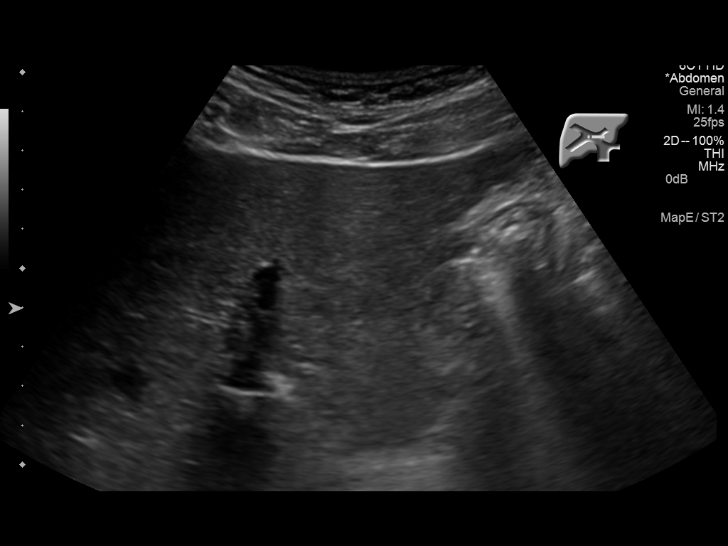
[im 29/76]
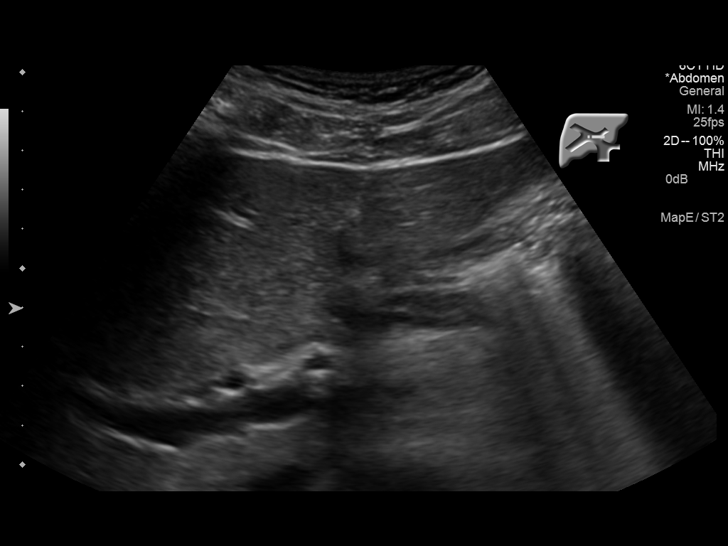
[im 35/76]
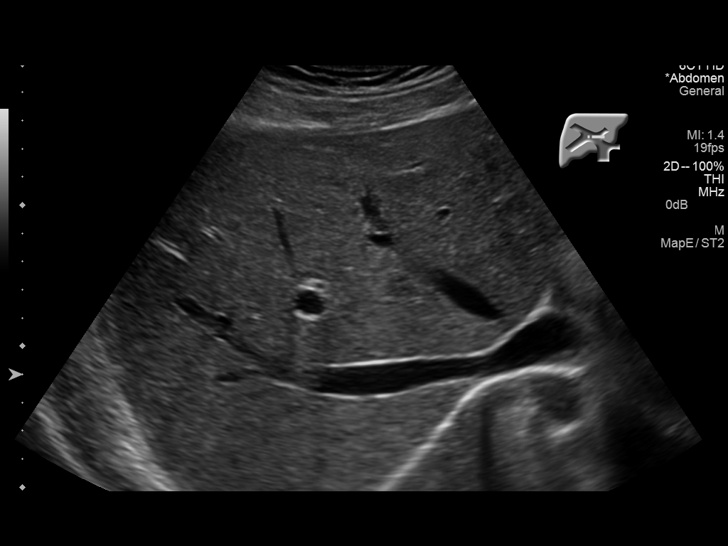
[im 41/76]
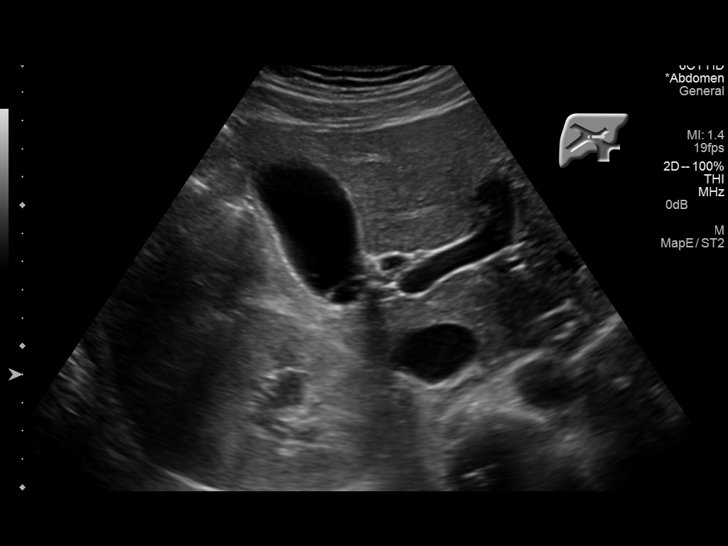
[im 47/76]
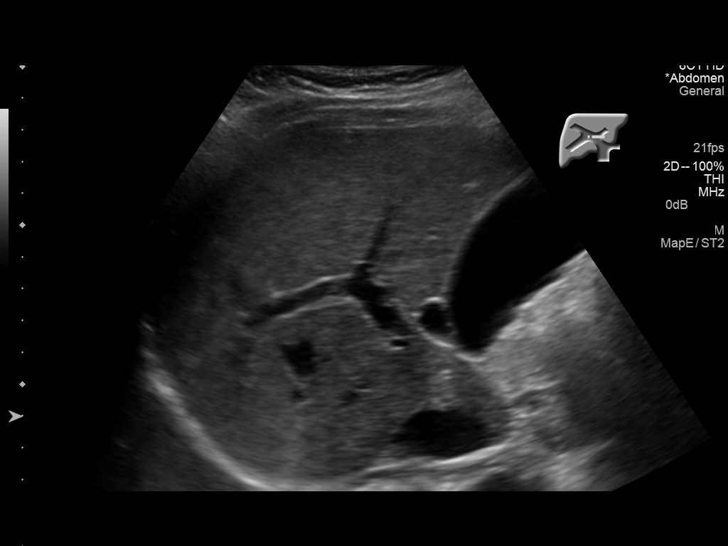
[im 51/76]
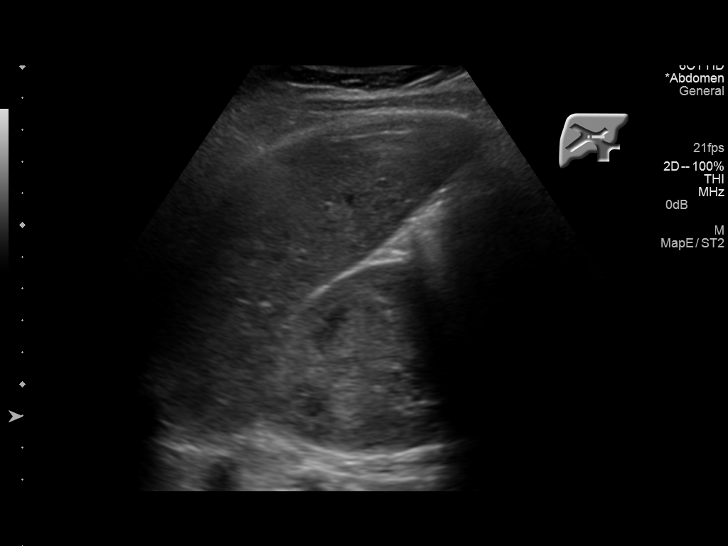
[im 57/76]
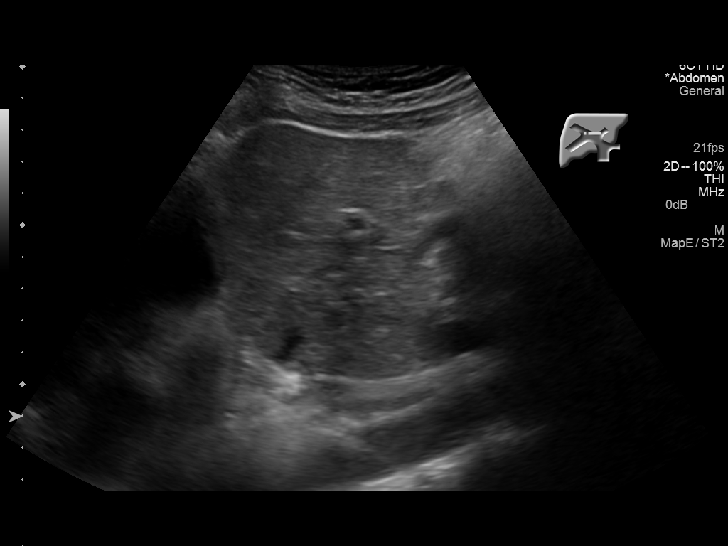
[im 63/76]
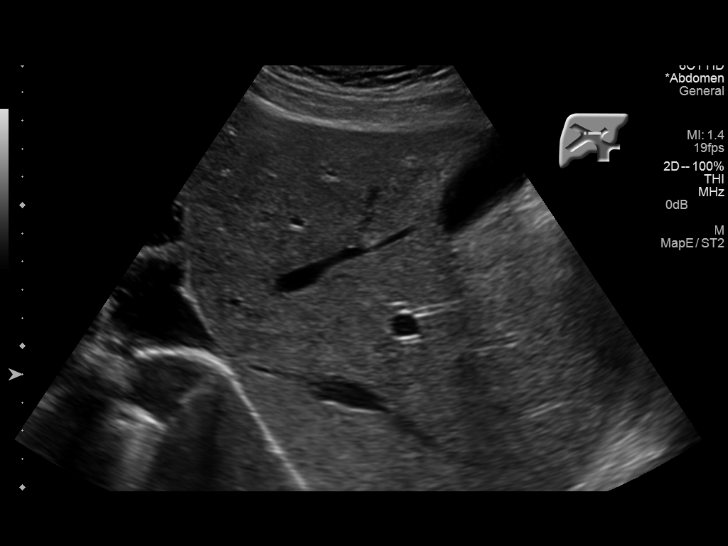
[im 69/76]
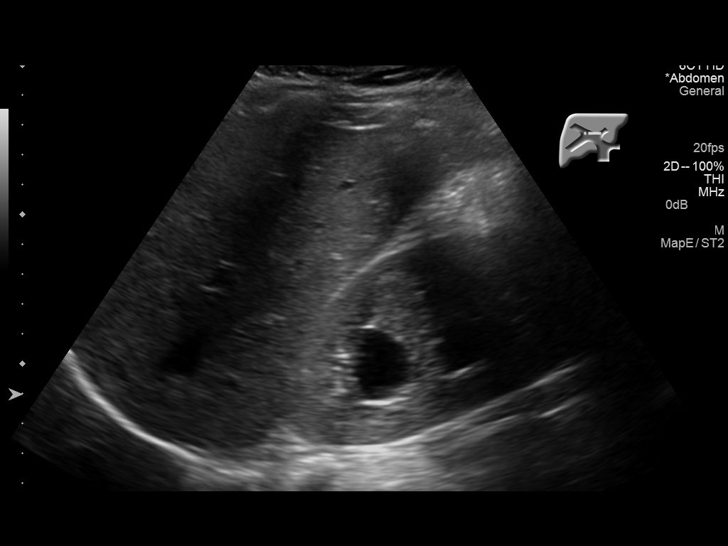
[im 76/76]
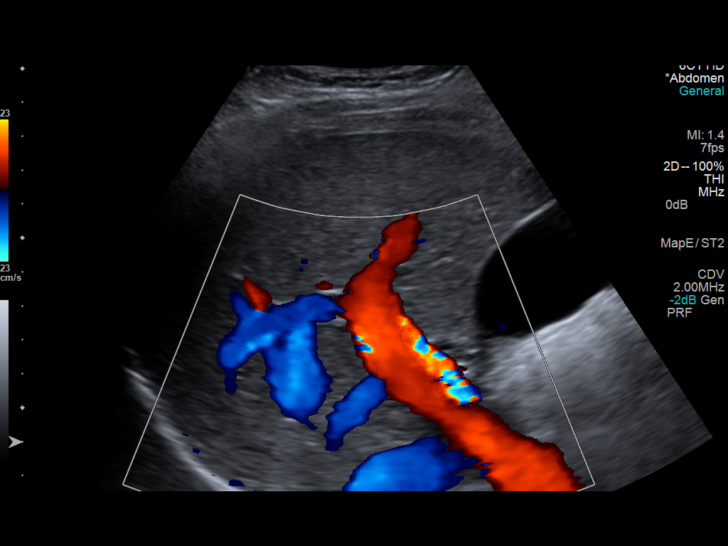

[14 of 25 positions shown; findings below may reference images not displayed]

FINDINGS: Gallbladder:

No gallstones or wall thickening visualized. No sonographic Murphy
sign noted.

Common bile duct:

Diameter: 4.4 mm which is within normal limits

Liver:

No focal lesion identified. Within normal limits in parenchymal
echogenicity.

Two rounded low densities are noted in the right kidney most
consistent with dilated calices or possibly cysts.
IMPRESSION: No definite abnormality seen in the liver or gallbladder. Two
rounded low densities are noted in the right kidney which may
represent simple cysts or possibly dilated calices related to
hydronephrosis. Renal ultrasound is recommended for further
evaluation.

## 2016-01-30 IMAGING — US US RENAL
1 series · 14 of 25 positions shown · non-contrast
Comparison: Abdominal ultrasound dated 11/23/2014

CLINICAL DATA: 23 weeks pregnant, right flank pain, hydronephrosis

EXAM:
RENAL/URINARY TRACT ULTRASOUND COMPLETE

[Series 1: us renal · 0.21mm/px · 14 of 48 slices shown]
[im 1/48]
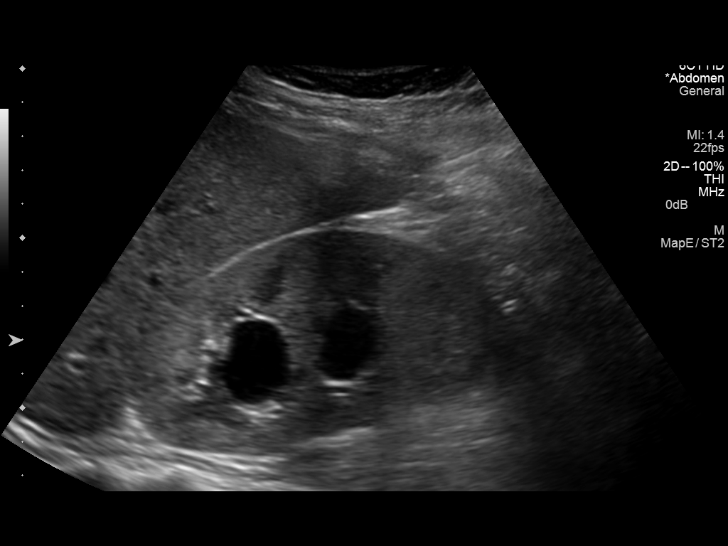
[im 4/48]
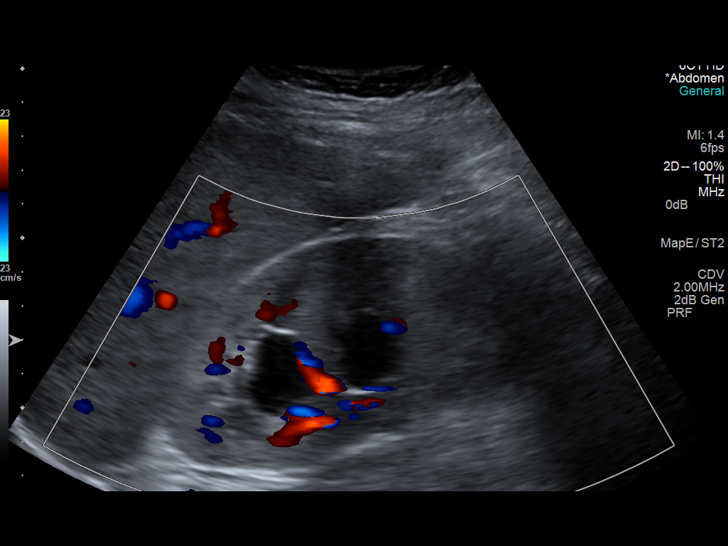
[im 8/48]
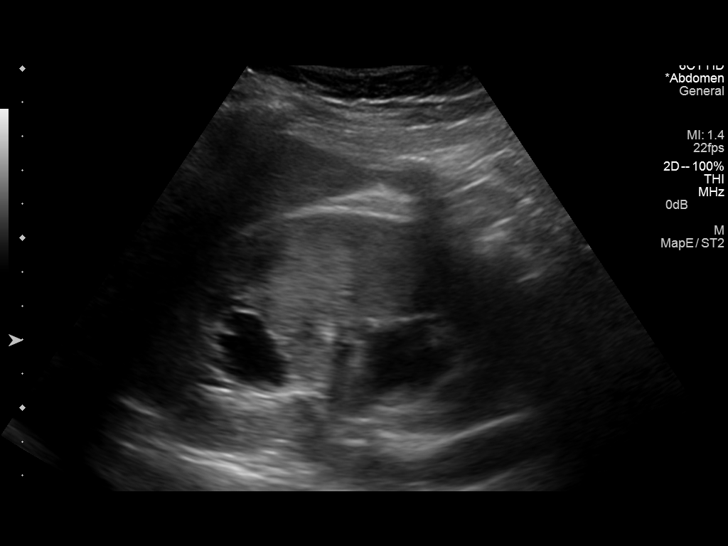
[im 12/48]
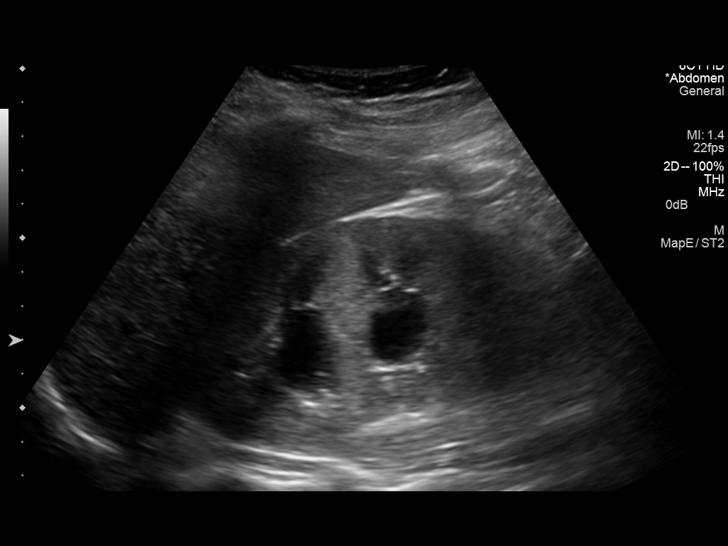
[im 16/48]
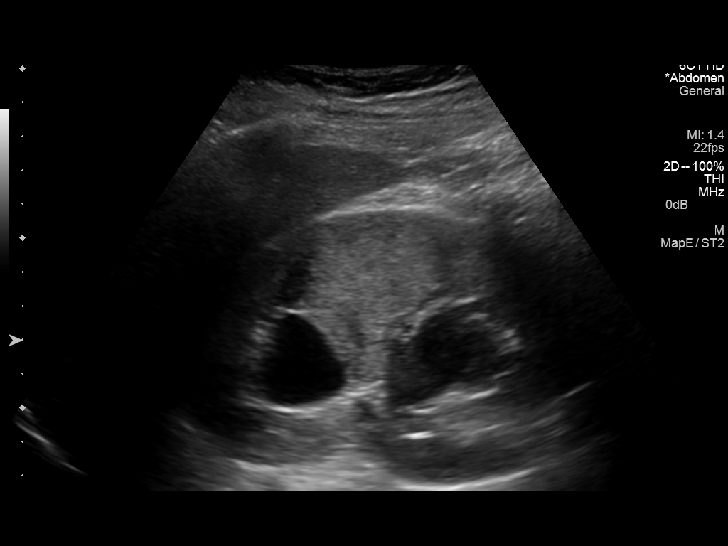
[im 18/48]
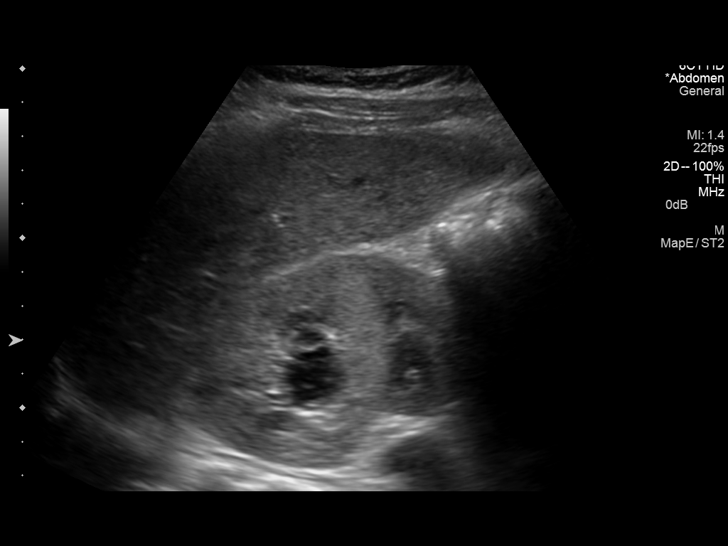
[im 22/48]
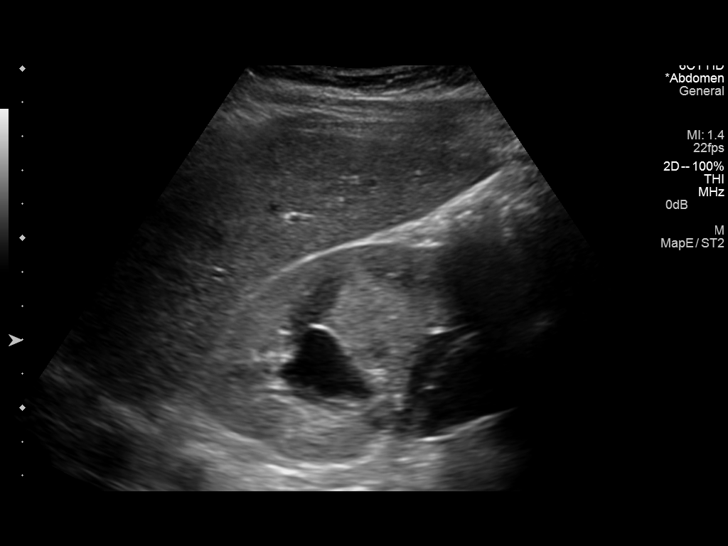
[im 26/48]
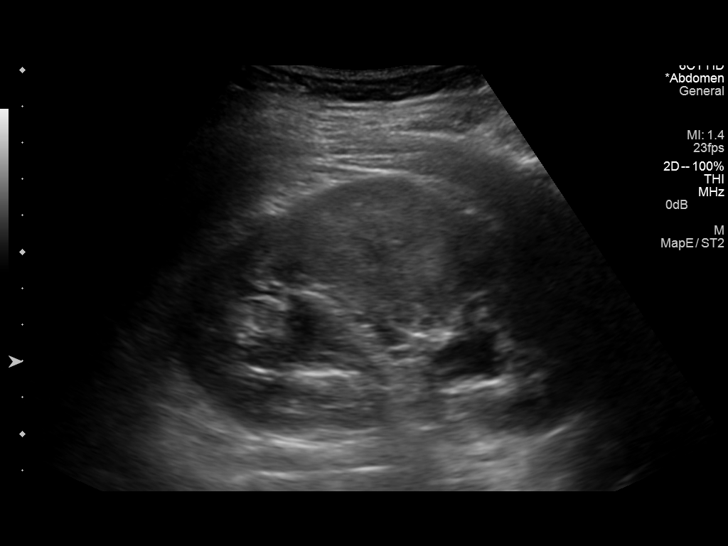
[im 30/48]
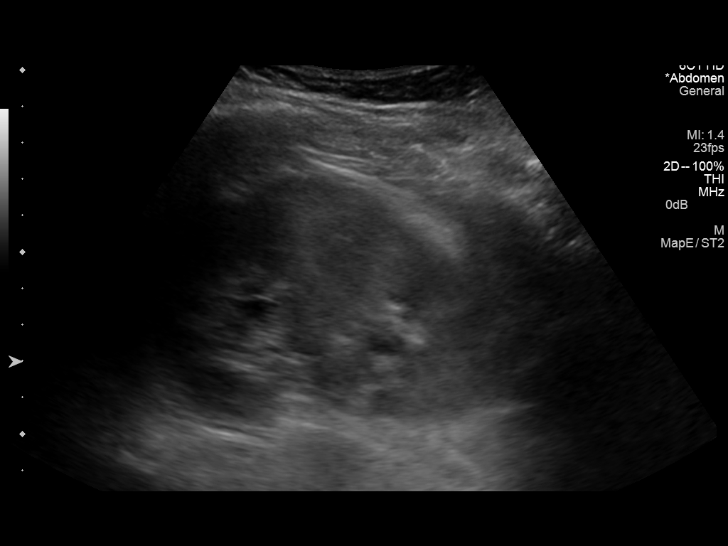
[im 32/48]
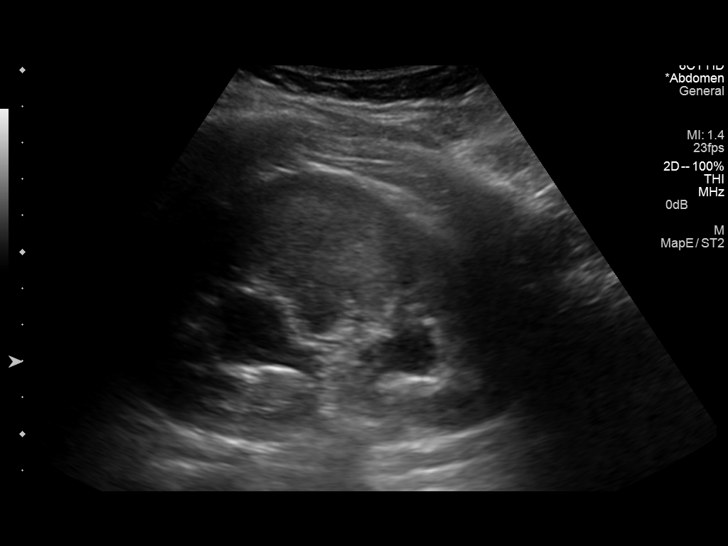
[im 36/48]
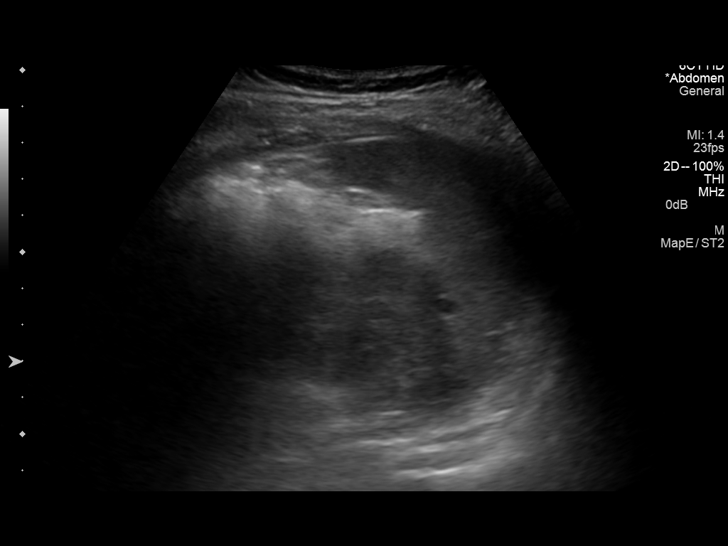
[im 40/48]
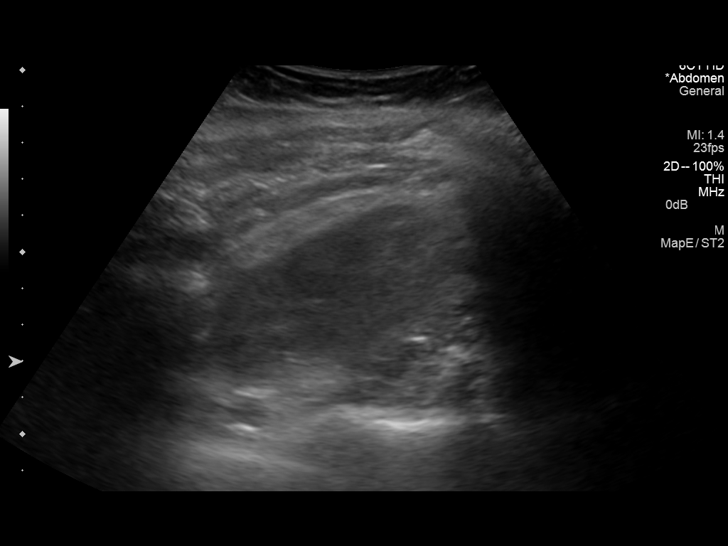
[im 44/48]
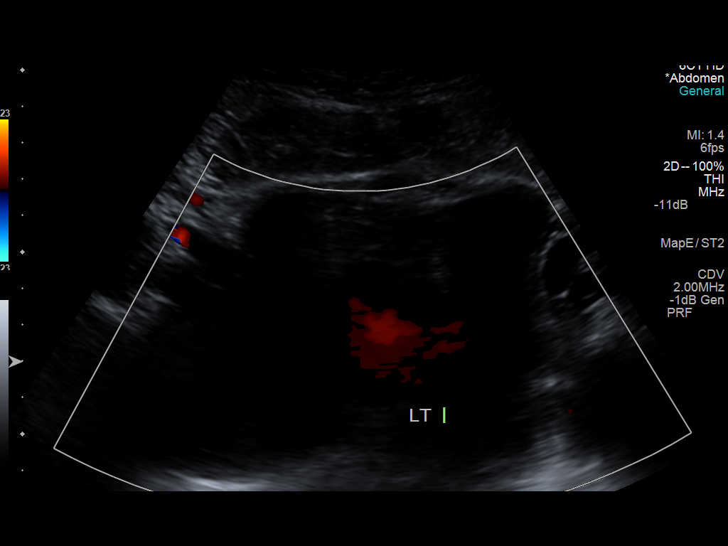
[im 48/48]
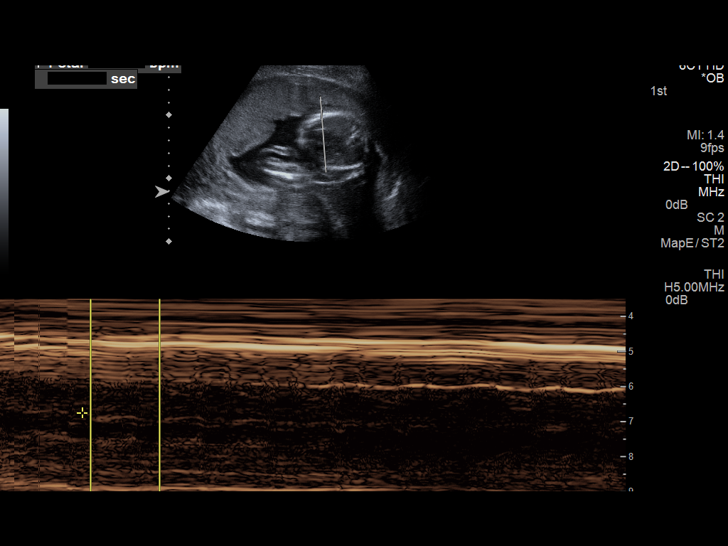

[14 of 25 positions shown; findings below may reference images not displayed]

FINDINGS: Right Kidney:

Length: 11.4 cm.  Mild hydronephrosis.

Left Kidney:

Length: 11.6 cm.  No mass or hydronephrosis.

Bladder:

Within normal limits.  Bilateral bladder jets are visualized.
IMPRESSION: Mild right hydronephrosis, possibly physiologic/secondary to
extrinsic compression.

## 2017-02-21 ENCOUNTER — Ambulatory Visit: Admitting: Family Medicine

## 2017-03-01 ENCOUNTER — Ambulatory Visit (INDEPENDENT_AMBULATORY_CARE_PROVIDER_SITE_OTHER): Admitting: Nurse Practitioner

## 2017-03-01 ENCOUNTER — Encounter: Payer: Self-pay | Admitting: Nurse Practitioner

## 2017-03-01 ENCOUNTER — Encounter: Payer: Self-pay | Admitting: Family Medicine

## 2017-03-01 VITALS — BP 132/80 | Temp 99.2°F | Ht 62.5 in | Wt 167.0 lb

## 2017-03-01 DIAGNOSIS — J111 Influenza due to unidentified influenza virus with other respiratory manifestations: Secondary | ICD-10-CM

## 2017-03-01 DIAGNOSIS — J209 Acute bronchitis, unspecified: Secondary | ICD-10-CM | POA: Diagnosis not present

## 2017-03-01 DIAGNOSIS — R062 Wheezing: Secondary | ICD-10-CM

## 2017-03-01 MED ORDER — HYDROCODONE-HOMATROPINE 5-1.5 MG/5ML PO SYRP: 5.0000 mL | ORAL_SOLUTION | ORAL | 0 refills | Status: DC | PRN

## 2017-03-01 MED ORDER — ALBUTEROL SULFATE HFA 108 (90 BASE) MCG/ACT IN AERS: 2.0000 | INHALATION_SPRAY | RESPIRATORY_TRACT | 0 refills | Status: DC | PRN

## 2017-03-01 MED ORDER — ALBUTEROL SULFATE (2.5 MG/3ML) 0.083% IN NEBU
2.5000 mg | INHALATION_SOLUTION | Freq: Once | RESPIRATORY_TRACT | Status: AC
  Administered 2017-03-01: 2.5 mg via RESPIRATORY_TRACT

## 2017-03-01 MED ORDER — PREDNISONE 20 MG PO TABS: ORAL_TABLET | ORAL | 0 refills | Status: DC

## 2017-03-01 MED ORDER — AMOXICILLIN-POT CLAVULANATE 875-125 MG PO TABS: 1.0000 | ORAL_TABLET | Freq: Two times a day (BID) | ORAL | 0 refills | Status: DC

## 2017-03-02 ENCOUNTER — Encounter: Payer: Self-pay | Admitting: Nurse Practitioner

## 2017-03-02 NOTE — Progress Notes (Signed)
Subjective:  Presents for c/o fever, cough, body aches and sore throat that began on 4/2. Fever started 4/3, max temp 104, ran 103 today. Generalized headache. Worsening cough with wheezing. No ear pain. No V/D or abd pain. Taking fluids well. Voiding nl.   Objective:   BP 132/80   Temp 99.2 F (37.3 C) (Oral)   Ht 5' 2.5" (1.588 m)   Wt 167 lb (75.8 kg)   BMI 30.06 kg/m  NAD. Alert, oriented. TMs clear effusion. Pharynx posterior mild erythema with PND noted. Neck supple with mild anterior adenopathy. Lungs initially mildly diminished breath sounds with faint inspiratory and expiratory wheezes. Scattered expiratory crackles. No tachypnea. Normal color. Given albuterol 2.5 mg neb treatment. Airflow improved. Wheezing improved. Subjective improvement of breathing. Heart RRR.   Assessment:  Influenza  Acute bronchitis, unspecified organism  Wheezing - Plan: albuterol (PROVENTIL) (2.5 MG/3ML) 0.083% nebulizer solution 2.5 mg    Plan:   Meds ordered this encounter  Medications  . albuterol (PROVENTIL) (2.5 MG/3ML) 0.083% nebulizer solution 2.5 mg  . albuterol (PROVENTIL HFA;VENTOLIN HFA) 108 (90 Base) MCG/ACT inhaler    Sig: Inhale 2 puffs into the lungs every 4 (four) hours as needed.    Dispense:  1 Inhaler    Refill:  0    Order Specific Question:   Supervising Provider    Answer:   Merlyn Albert [[id number]]  . predniSONE (DELTASONE) 20 MG tablet    Sig: 3 po qd x 3 d then 2 po qd x 3 d then 1 po qd x 2 d    Dispense:  17 tablet    Refill:  0    Order Specific Question:   Supervising Provider    Answer:   Merlyn Albert [[id number]]  . amoxicillin-clavulanate (AUGMENTIN) 875-125 MG tablet    Sig: Take 1 tablet by mouth 2 (two) times daily.    Dispense:  20 tablet    Refill:  0    Order Specific Question:   Supervising Provider    Answer:   Merlyn Albert [[id number]]  . HYDROcodone-homatropine (HYCODAN) 5-1.5 MG/5ML syrup    Sig: Take 5 mLs by mouth every 4 (four) hours as needed.     Dispense:  90 mL    Refill:  0    Order Specific Question:   Supervising Provider    Answer:   Merlyn Albert [[id number]]   Symptomatic care and warning signs reviewed.  Call back in 72 hours if no improvement, call or go to ED sooner if worse.

## 2020-05-06 ENCOUNTER — Ambulatory Visit: Payer: Self-pay | Attending: Family

## 2020-05-06 DIAGNOSIS — Z23 Encounter for immunization: Secondary | ICD-10-CM

## 2020-05-06 NOTE — Progress Notes (Signed)
   Covid-19 Vaccination Clinic  Name:  Morgan Conrad    MRN: [id number] DOB: Jan 11, 1991  05/06/2020  Morgan Conrad was observed post Covid-19 immunization for 15 minutes without incident. She was provided with Vaccine Information Sheet and instruction to access the V-Safe system.   Morgan Conrad was instructed to call 911 with any severe reactions post vaccine: Marland Kitchen Difficulty breathing  . Swelling of face and throat  . A fast heartbeat  . A bad rash all over body  . Dizziness and weakness

## 2020-05-24 ENCOUNTER — Ambulatory Visit
Admission: EM | Admit: 2020-05-24 | Discharge: 2020-05-24 | Disposition: A | Discharge status: Home / Self Care | Attending: Emergency Medicine | Admitting: Emergency Medicine

## 2020-05-24 ENCOUNTER — Telehealth: Payer: Self-pay | Admitting: Adult Health

## 2020-05-24 DIAGNOSIS — Z3200 Encounter for pregnancy test, result unknown: Secondary | ICD-10-CM

## 2020-05-24 LAB — POCT URINE PREGNANCY: Preg Test, Ur: NEGATIVE

## 2020-05-24 NOTE — Discharge Instructions (Signed)
POCT urine pregnancy test was negative Follow-up with PCP Return or go to ED for worsening of symptoms

## 2020-05-24 NOTE — ED Triage Notes (Signed)
Pt had abortion on may 21st and has had a period since then , but took follow up pregnancy test at home and is showing positive.

## 2020-05-24 NOTE — Telephone Encounter (Signed)
Patient has a question about a medical abortion.

## 2020-05-24 NOTE — Telephone Encounter (Signed)
Pt had medical abortion May 21,had +then - test,  had negative UPT today at Urgent care has pending [facility], Has appt 05/26/20, wants to get on birth control.No sex and keep that appt.

## 2020-05-24 NOTE — ED Provider Notes (Signed)
[place]   [id number] 05/24/20 Arrival Time: 3382   CC: Pregnancy test   SUBJECTIVE: History from: patient.  Morgan Conrad is a 29 y.o. female who presented to the urgent care for complaint that she will to have a pregnancy test completed.  Reports she had abortion done on May 21 and was instructed to have the pregnancy test done.  Reported she did and it was positive.  Return to the hospital and ultrasound was completed and was negative.  Had another pregnancy test done and it was positive.  Denies any other pregnancy symptoms.  Denies chills, fever, nausea, vomiting, diarrhea.   ROS: As per HPI.  All other pertinent ROS negative.     Past Medical History:  Diagnosis Date   Encounter for Nexplanon removal 10/21/2013   History of termination of pregnancy 01/09/2014   Medical history non-contributory    Nexplanon insertion 05/25/2015   Past Surgical History:  Procedure Laterality Date   CESAREAN SECTION N/A 03/19/2015   Procedure: CESAREAN SECTION;  Surgeon: Truett Mainland, DO;  Location: [facility];  Service: Obstetrics;  Laterality: N/A;   KNEE SURGERY Right    No Known Allergies No current facility-administered medications on file prior to encounter.   Current Outpatient Medications on File Prior to Encounter  Medication Sig Dispense Refill   albuterol (PROVENTIL HFA;VENTOLIN HFA) 108 (90 Base) MCG/ACT inhaler Inhale 2 puffs into the lungs every 4 (four) hours as needed. 1 Inhaler 0   amoxicillin-clavulanate (AUGMENTIN) 875-125 MG tablet Take 1 tablet by mouth 2 (two) times daily. 20 tablet 0   etonogestrel (NEXPLANON) 68 MG IMPL implant 1 each by Subdermal route once.     HYDROcodone-homatropine (HYCODAN) 5-1.5 MG/5ML syrup Take 5 mLs by mouth every 4 (four) hours as needed. 90 mL 0   megestrol (MEGACE) 40 MG tablet Take 3/day (at the same time) for 5 days; 2/day for 5 days, then 1/day PO prn bleeding 90 tablet 3   predniSONE (DELTASONE) 20 MG tablet 3 po  qd x 3 d then 2 po qd x 3 d then 1 po qd x 2 d 17 tablet 0   Social History   Socioeconomic History   Marital status: Married    Spouse name: Not on file   Number of children: Not on file   Years of education: Not on file   Highest education level: Not on file  Occupational History   Not on file  Tobacco Use   Smoking status: Former Smoker    Packs/day: 0.25    Types: Cigarettes   Smokeless tobacco: Never Used  Substance and Sexual Activity   Alcohol use: No   Drug use: No   Sexual activity: Not Currently    Birth control/protection: None  Other Topics Concern   Not on file  Social History Narrative   Not on file   Social Determinants of Health   Financial Resource Strain:    Difficulty of Paying Living Expenses:   Food Insecurity:    Worried About Charity fundraiser in the Last Year:    Arboriculturist in the Last Year:   Transportation Needs:    Film/video editor (Medical):    Lack of Transportation (Non-Medical):   Physical Activity:    Days of Exercise per Week:    Minutes of Exercise per Session:   Stress:    Feeling of Stress :   Social Connections:    Frequency of Communication with Friends and Family:  Frequency of Social Gatherings with Friends and Family:    Attends Religious Services:    Active Member of Clubs or Organizations:    Attends Engineer, structural:    Marital Status:   Intimate Partner Violence:    Fear of Current or Ex-Partner:    Emotionally Abused:    Physically Abused:    Sexually Abused:    Family History  Problem Relation Age of Onset   Hypertension Maternal Grandfather    Diabetes Maternal Grandfather     OBJECTIVE:  Vitals:   05/24/20 1111  BP: 113/69  Pulse: 69  Resp: 18  Temp: 98.1 F (36.7 C)  SpO2: 99%     General appearance: alert; appears fatigued, but nontoxic; speaking in full sentences and tolerating own secretions HEENT: NCAT; Ears: EACs clear, TMs pearly  gray; Eyes: PERRL.  EOM grossly intact. Sinuses: nontender; Nose: nares patent without rhinorrhea, Throat: oropharynx clear, tonsils non erythematous or enlarged, uvula midline  Neck: supple without LAD Lungs: unlabored respirations, symmetrical air entry; cough: absent; no respiratory distress; CTAB Heart: regular rate and rhythm.  Radial pulses 2+ symmetrical bilaterally Skin: warm and dry Psychological: alert and cooperative; normal mood and affect  LABS:  Results for orders placed or performed during the hospital encounter of 05/24/20 (from the past 24 hour(s))  POCT urine pregnancy     Status: None   Collection Time: 05/24/20 11:21 AM  Result Value Ref Range   Preg Test, Ur Negative Negative     ASSESSMENT & PLAN:  1. Encounter for pregnancy test, result unknown     No orders of the defined types were placed in this encounter.  POCT urine pregnancy test was completed and was negative.  Patient states she would like to have the blood pregnancy test done to have peace of mind.  Test was ordered.   Discharge instructions POCT urine pregnancy test was negative Follow-up with PCP Return or go to ED for worsening of symptoms  Reviewed expectations re: course of current medical issues. Questions answered. Outlined signs and symptoms indicating need for more acute intervention. Patient verbalized understanding. After Visit Summary given.     Note: This document was prepared using Dragon voice recognition software and may include unintentional dictation errors.     Durward Parcel, FNP 05/24/20 1135

## 2020-05-26 ENCOUNTER — Other Ambulatory Visit: Payer: Self-pay

## 2020-05-26 ENCOUNTER — Encounter: Payer: Self-pay | Admitting: Advanced Practice Midwife

## 2020-05-26 ENCOUNTER — Ambulatory Visit (INDEPENDENT_AMBULATORY_CARE_PROVIDER_SITE_OTHER): Admitting: Advanced Practice Midwife

## 2020-05-26 VITALS — BP 110/73 | HR 93 | Ht 63.0 in | Wt 163.0 lb

## 2020-05-26 DIAGNOSIS — Z3201 Encounter for pregnancy test, result positive: Secondary | ICD-10-CM

## 2020-05-26 DIAGNOSIS — Z8742 Personal history of other diseases of the female genital tract: Secondary | ICD-10-CM

## 2020-05-26 DIAGNOSIS — O074 Failed attempted termination of pregnancy without complication: Secondary | ICD-10-CM

## 2020-05-26 DIAGNOSIS — Z3009 Encounter for other general counseling and advice on contraception: Secondary | ICD-10-CM | POA: Diagnosis not present

## 2020-05-26 LAB — POCT URINE PREGNANCY: Preg Test, Ur: POSITIVE — AB

## 2020-05-26 LAB — SPECIMEN STATUS REPORT

## 2020-05-26 LAB — BETA HCG QUANT (REF LAB): hCG Quant: 22 m[IU]/mL

## 2020-05-26 NOTE — Progress Notes (Signed)
   GYN VISIT Patient name: Morgan Conrad MRN [id number]  Date of birth: Dec 16, 1990 Chief Complaint:   Contraception (had recent TAB, wants to discuss options)  History of Present Illness:   Morgan Conrad is a 29 y.o. G79P1011 African American female being seen today for f/u of medical EAB on May 21st, 2021 followed by intermittent +UPTs. She had bleeding at the time of the EAB. Most recently had neg UPT at Urgent Care 6/28, but the beta Hcg that was drawn is not resulted in the computer. She had what seemed like a menses from June 17-22, with no further bleeding.    Depression screen [facility] 2/9 05/26/2020 12/30/2014  Decreased Interest 0 0  Down, Depressed, Hopeless 0 0  PHQ - 2 Score 0 0  Altered sleeping 1 -  Tired, decreased energy 0 -  Change in appetite 0 -  Feeling bad or failure about yourself  0 -  Trouble concentrating 0 -  Moving slowly or fidgety/restless 0 -  Suicidal thoughts 0 -  PHQ-9 Score 1 -    Patient's last menstrual period was 05/13/2020 (exact date). The current method of family planning is considering IUD- unsure of Paragard vs Liletta.  Last pap 2014 according to computer, but didn't discuss any outside testing.  Review of Systems:   Pertinent items are noted in HPI Denies fever/chills, dizziness, headaches, visual disturbances, fatigue, shortness of breath, chest pain, abdominal pain, vomiting, abnormal vaginal discharge/itching/odor/irritation, problems with periods, bowel movements, urination, or intercourse unless otherwise stated above.  Pertinent History Reviewed:  Reviewed past medical,surgical, social, obstetrical and family history.  Reviewed problem list, medications and allergies. Physical Assessment:   Vitals:   05/26/20 1414  BP: 110/73  Pulse: 93  Weight: 163 lb (73.9 kg)  Height: 5\' 3"  (1.6 m)  Body mass index is 28.87 kg/m.       Physical Examination:   General appearance: alert, well appearing, and in no distress  Mental status: alert,  oriented to person, place, and time  Skin: warm & dry   Cardiovascular: normal heart rate noted  Respiratory: normal respiratory effort, no distress  Abdomen: soft, non-tender   Pelvic: examination not indicated  Extremities: no edema      Results for orders placed or performed in visit on 05/26/20 (from the past 24 hour(s))  POCT urine pregnancy   Collection Time: 05/26/20  2:25 PM  Result Value Ref Range   Preg Test, Ur Positive (A) Negative    Assessment & Plan:  1) S/p medical EAB on May 21st with intermittent positive UPT> plan for beta Hcg today and then repeat in 48hrs; will make plan based on decreasing vs increasing values; discussed with May 23- pt told to call when office re-opens on July 6th to discuss plan  2) Desires IUD for contraception> leaning towards Paragard; husband is currently out of town with the 11-19-1989- no sex since April  Meds: No orders of the defined types were placed in this encounter.   Orders Placed This Encounter  Procedures  . Beta hCG quant (ref lab)  . Beta hCG quant (ref lab)  . POCT urine pregnancy    Return for prn.  May CNM 05/26/2020 2:58 PM

## 2020-05-27 LAB — BETA HCG QUANT (REF LAB): hCG Quant: 18 m[IU]/mL

## 2020-05-29 LAB — BETA HCG QUANT (REF LAB): hCG Quant: 12 m[IU]/mL

## 2020-06-01 ENCOUNTER — Telehealth: Payer: Self-pay | Admitting: Adult Health

## 2020-06-01 ENCOUNTER — Ambulatory Visit: Attending: Family

## 2020-06-01 DIAGNOSIS — Z23 Encounter for immunization: Secondary | ICD-10-CM

## 2020-06-01 NOTE — Telephone Encounter (Signed)
Pt called and LMOM that she had labs last week and was to call Jenn and follow up with information. Please call

## 2020-06-01 NOTE — Progress Notes (Signed)
   Covid-19 Vaccination Clinic  Name:  Aymar Whitfill    MRN: [id number] DOB: [birth year]  06/01/2020  Ms. Fialkowski was observed post Covid-19 immunization for 15 minutes without incident. She was provided with Vaccine Information Sheet and instruction to access the V-Safe system.   Ms. Natzke was instructed to call 911 with any severe reactions post vaccine: Marland Kitchen Difficulty breathing  . Swelling of face and throat  . A fast heartbeat  . A bad rash all over body  . Dizziness and weakness   Immunizations Administered    Name Date Dose VIS Date Route   Moderna COVID-19 Vaccine 06/01/2020  1:10 PM 0.5 mL 10/2019 Intramuscular   Manufacturer: Moderna   Lot: [id number]   NDC: 65993-570-17

## 2020-06-01 NOTE — Telephone Encounter (Signed)
Telephoned patient at home number and advised patient to have labs drawn. Victorino Dike will call back after results. Patient voiced understanding.

## 2020-06-01 NOTE — Telephone Encounter (Signed)
Pt called LMOM got message from [facility] about having labs done she needed to know if she needed an order

## 2020-06-01 NOTE — Telephone Encounter (Signed)
Left message I called and looks like has [facility] today, will talk after that.

## 2020-06-02 LAB — BETA HCG QUANT (REF LAB): hCG Quant: 5 m[IU]/mL

## 2021-12-12 ENCOUNTER — Ambulatory Visit: Payer: Self-pay | Admitting: Nurse Practitioner

## 2021-12-12 ENCOUNTER — Encounter: Payer: Self-pay | Admitting: Nurse Practitioner

## 2021-12-15 ENCOUNTER — Ambulatory Visit: Payer: Self-pay | Admitting: Cardiology

## 2022-01-03 ENCOUNTER — Encounter: Payer: Self-pay | Admitting: Nurse Practitioner

## 2022-10-26 ENCOUNTER — Encounter (HOSPITAL_COMMUNITY): Payer: Self-pay | Admitting: Emergency Medicine

## 2022-10-26 ENCOUNTER — Emergency Department (HOSPITAL_COMMUNITY)
Admission: EM | Admit: 2022-10-26 | Discharge: 2022-10-26 | Disposition: A | Discharge status: Home / Self Care | Attending: Emergency Medicine | Admitting: Emergency Medicine

## 2022-10-26 ENCOUNTER — Emergency Department (HOSPITAL_BASED_OUTPATIENT_CLINIC_OR_DEPARTMENT_OTHER)

## 2022-10-26 ENCOUNTER — Emergency Department (HOSPITAL_COMMUNITY)

## 2022-10-26 DIAGNOSIS — M7989 Other specified soft tissue disorders: Secondary | ICD-10-CM | POA: Diagnosis not present

## 2022-10-26 DIAGNOSIS — R2233 Localized swelling, mass and lump, upper limb, bilateral: Secondary | ICD-10-CM | POA: Insufficient documentation

## 2022-10-26 DIAGNOSIS — R52 Pain, unspecified: Secondary | ICD-10-CM | POA: Diagnosis not present

## 2022-10-26 DIAGNOSIS — D72829 Elevated white blood cell count, unspecified: Secondary | ICD-10-CM | POA: Insufficient documentation

## 2022-10-26 DIAGNOSIS — M25512 Pain in left shoulder: Secondary | ICD-10-CM | POA: Insufficient documentation

## 2022-10-26 DIAGNOSIS — R6884 Jaw pain: Secondary | ICD-10-CM | POA: Insufficient documentation

## 2022-10-26 LAB — COMPREHENSIVE METABOLIC PANEL
ALT: 14 U/L (ref 0–44)
AST: 18 U/L (ref 15–41)
Albumin: 3.8 g/dL (ref 3.5–5.0)
Alkaline Phosphatase: 46 U/L (ref 38–126)
Anion gap: 10 (ref 5–15)
BUN: 10 mg/dL (ref 6–20)
CO2: 22 mmol/L (ref 22–32)
Calcium: 8.8 mg/dL — ABNORMAL LOW (ref 8.9–10.3)
Chloride: 104 mmol/L (ref 98–111)
Creatinine, Ser: 0.75 mg/dL (ref 0.44–1.00)
GFR, Estimated: >60 mL/min (ref >60)
Glucose, Bld: 115 mg/dL — ABNORMAL HIGH (ref 70–99)
Potassium: 3.6 mmol/L (ref 3.5–5.1)
Sodium: 136 mmol/L (ref 135–145)
Total Bilirubin: 0.6 mg/dL (ref 0.3–1.2)
Total Protein: 7.7 g/dL (ref 6.5–8.1)

## 2022-10-26 LAB — CBC WITH DIFFERENTIAL/PLATELET
Abs Immature Granulocytes: 0.04 10*3/uL (ref 0.00–0.07)
Basophils Absolute: 0 10*3/uL (ref 0.0–0.1)
Basophils Relative: 0 %
Eosinophils Absolute: 0.1 10*3/uL (ref 0.0–0.5)
Eosinophils Relative: 0 %
HCT: 36.6 % (ref 36.0–46.0)
Hemoglobin: 12 g/dL (ref 12.0–15.0)
Immature Granulocytes: 0 %
Lymphocytes Relative: 9 %
Lymphs Abs: 1.1 10*3/uL (ref 0.7–4.0)
MCH: 29.6 pg (ref 26.0–34.0)
MCHC: 32.8 g/dL (ref 30.0–36.0)
MCV: 90.1 fL (ref 80.0–100.0)
Monocytes Absolute: 0.6 10*3/uL (ref 0.1–1.0)
Monocytes Relative: 5 %
Neutro Abs: 10.2 10*3/uL — ABNORMAL HIGH (ref 1.7–7.7)
Neutrophils Relative %: 86 %
Platelets: 360 10*3/uL (ref 150–400)
RBC: 4.06 MIL/uL (ref 3.87–5.11)
RDW: 13.1 % (ref 11.5–15.5)
WBC: 11.9 10*3/uL — ABNORMAL HIGH (ref 4.0–10.5)
nRBC: 0 % (ref 0.0–0.2)

## 2022-10-26 LAB — I-STAT BETA HCG BLOOD, ED (MC, WL, AP ONLY): I-stat hCG, quantitative: <5 m[IU]/mL (ref <5)

## 2022-10-26 LAB — CK: Total CK: 120 U/L (ref 38–234)

## 2022-10-26 LAB — TROPONIN I (HIGH SENSITIVITY): Troponin I (High Sensitivity): 6 ng/L (ref <18)

## 2022-10-26 MED ORDER — KETOROLAC TROMETHAMINE 30 MG/ML IJ SOLN
60.0000 mg | Freq: Once | INTRAMUSCULAR | Status: AC
  Administered 2022-10-26: 60 mg via INTRAMUSCULAR
  Filled 2022-10-26: qty 2

## 2022-10-26 MED ORDER — HYDROCODONE-ACETAMINOPHEN 5-325 MG PO TABS
2.0000 | ORAL_TABLET | Freq: Once | ORAL | Status: AC
  Administered 2022-10-26: 2 via ORAL
  Filled 2022-10-26: qty 2

## 2022-10-26 NOTE — ED Triage Notes (Signed)
Pt endorses bilateral hand swelling, left shoulder pain and mouth soreness since yesterday. Denies any trauma.

## 2022-10-26 NOTE — ED Provider Triage Note (Signed)
Emergency Medicine Provider Triage Evaluation Note  Morgan Conrad , a 31 y.o. female  was evaluated in triage.  Pt complains of severe bilateral arm pain.  She denies any known triggering event prior to pain beginning.  States the pain began yesterday morning in the middle of the night, but does not believe the pain is progressively worsened.  Has taken ibuprofen 200 milligrams 2 times with some relief in pain.  No oral contraceptive use.  No prior history of clots or known clotting disorder.  No known dog/cat/insect bite.  Review of Systems  Positive: As above Negative: As above  Physical Exam  BP (!) 148/86   Pulse 92   Temp 98.6 F (37 C) (Oral)   Resp 20   Ht 5' 2.5" (1.588 m)   Wt 81.6 kg   SpO2 99%   BMI 32.40 kg/m  Gen:   Awake, no distress, obvious discomfort Resp:  Normal effort  MSK:   Difficultly with moving bilateral hands. Other:    Medical Decision Making  Medically screening exam initiated at 5:59 PM.  Appropriate orders placed.  Morgan Conrad was informed that the remainder of the evaluation will be completed by another provider, this initial triage assessment does not replace that evaluation, and the importance of remaining in the ED until their evaluation is complete.     Smitty Knudsen, PA-C 10/26/22 1808

## 2022-10-26 NOTE — Progress Notes (Signed)
Upper extremity venous duplex has been completed.   Preliminary results in CV Proc.   Morgan Conrad 10/26/2022 6:41 PM

## 2022-10-26 NOTE — ED Provider Notes (Signed)
[facility] EMERGENCY DEPARTMENT Provider Note   CSN: [id number] Arrival date & time: 10/26/22  1653     History  Chief Complaint  Patient presents with   Arm Swelling    Morgan Conrad is a 31 y.o. female.  He has no significant past medical history.  She is complaining of pain and swelling in both of her hands left shoulder and jaw that started yesterday.  No numbness or weakness.  Pain is worse with movement.  She rates it as severe.  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  She said she had not done that for a while.  No chest pain no shortness of breath diaphoresis fevers or chills.  No nausea or vomiting.  The history is provided by the patient.  Extremity Pain This is a new problem. The current episode started yesterday. The problem occurs constantly. The problem has not changed since onset.Pertinent negatives include no chest pain, no abdominal pain, no headaches and no shortness of breath. The symptoms are aggravated by bending and twisting. Nothing relieves the symptoms. She has tried rest for the symptoms. The treatment provided no relief.       Home Medications Prior to Admission medications   Not on File      Allergies    Patient has no known allergies.    Review of Systems   Review of Systems  Constitutional:  Negative for fever.  Eyes:  Negative for visual disturbance.  Respiratory:  Negative for shortness of breath.   Cardiovascular:  Negative for chest pain.  Gastrointestinal:  Negative for abdominal pain.  Musculoskeletal:  Negative for neck pain.  Skin:  Negative for rash and wound.  Neurological:  Negative for weakness, numbness and headaches.    Physical Exam Updated Vital Signs BP (!) 148/86   Pulse 92   Temp 98.6 F (37 C) (Oral)   Resp 20   Ht 5' 2.5" (1.588 m)   Wt 81.6 kg   SpO2 99%   BMI 32.40 kg/m  Physical Exam Vitals and nursing note reviewed.   Constitutional:      General: She is not in acute distress.    Appearance: Normal appearance. She is well-developed.  HENT:     Head: Normocephalic and atraumatic.  Eyes:     Conjunctiva/sclera: Conjunctivae normal.  Cardiovascular:     Rate and Rhythm: Normal rate and regular rhythm.     Heart sounds: No murmur heard. Pulmonary:     Effort: Pulmonary effort is normal. No respiratory distress.     Breath sounds: Normal breath sounds.  Abdominal:     Palpations: Abdomen is soft.     Tenderness: There is no abdominal tenderness.  Musculoskeletal:        General: Tenderness present. No deformity. Normal range of motion.     Cervical back: Neck supple.     Comments: She has some diffuse tenderness of her left arm.  She has strong radial pulses bilaterally and I do not appreciate any significant swelling of her hands.  There is no skin changes.  She has normal strength and sensation.  Skin:    General: Skin is warm and dry.     Capillary Refill: Capillary refill takes less than 2 seconds.  Neurological:     General: No focal deficit present.     Mental Status: She is alert.     Sensory:  No sensory deficit.     Motor: No weakness.     ED Results / Procedures / Treatments   Labs (all labs ordered are listed, but only abnormal results are displayed) Labs Reviewed  CBC WITH DIFFERENTIAL/PLATELET - Abnormal; Notable for the following components:      Result Value   WBC 11.9 (*)    Neutro Abs 10.2 (*)    All other components within normal limits  COMPREHENSIVE METABOLIC PANEL - Abnormal; Notable for the following components:   Glucose, Bld 115 (*)    Calcium 8.8 (*)    All other components within normal limits  CK  I-STAT BETA HCG BLOOD, ED ([facility], [facility], [facility] ONLY)  TROPONIN I (HIGH SENSITIVITY)  TROPONIN I (HIGH SENSITIVITY)    EKG EKG Interpretation  Date/Time:  Thursday October 26 2022 21:14:04 EST Ventricular Rate:  94 PR Interval:  160 QRS Duration: 90 QT  Interval:  334 QTC Calculation: 417 R Axis:   65 Text Interpretation: Normal sinus rhythm Nonspecific T wave abnormality Abnormal ECG No previous ECGs available Confirmed by Aletta Edouard [phone]) on 10/26/2022 9:25:11 PM  Radiology DG Hand Complete Left  Result Date: 10/26/2022 CLINICAL DATA:  Left hand pain and swelling, initial encounter EXAM: LEFT HAND - COMPLETE 3+ VIEW COMPARISON:  None Available. FINDINGS: There is no evidence of fracture or dislocation. There is no evidence of arthropathy or other focal bone abnormality. Soft tissues are unremarkable. IMPRESSION: No acute abnormality noted. Electronically Signed   By: Inez Catalina M.D.   On: 10/26/2022 19:26   DG Hand Complete Right  Result Date: 10/26/2022 CLINICAL DATA:  Right hand pain and swelling, no known injury, initial encounter EXAM: RIGHT HAND - COMPLETE 3+ VIEW COMPARISON:  None Available. FINDINGS: There is no evidence of fracture or dislocation. There is no evidence of arthropathy or other focal bone abnormality. Soft tissues are unremarkable. IMPRESSION: No acute abnormality noted. Electronically Signed   By: Inez Catalina M.D.   On: 10/26/2022 19:24   UE VENOUS DUPLEX (7am - 7pm)  Result Date: 10/26/2022 UPPER VENOUS STUDY  Patient Name:  Morgan Conrad  Date of Exam:   10/26/2022 Medical Rec #: [id number]     Accession #:    [id number] Date of Birth: [birth year]     Patient Gender: F Patient Age:   77 years Exam Location:  [facility] Procedure:      VAS [place] UPPER EXTREMITY VENOUS DUPLEX Referring Phys: Lourdes Sledge --------------------------------------------------------------------------------  Indications: Pain, and Swelling Comparison Study: no prior Performing Technologist: Archie Patten RVS  Examination Guidelines: A complete evaluation includes B-mode imaging, spectral Doppler, color Doppler, and power Doppler as needed of all accessible portions of each vessel. Bilateral testing is considered an integral part of  a complete examination. Limited examinations for reoccurring indications may be performed as noted.  Right Findings: +----------+------------+---------+-----------+----------+-------+ RIGHT     CompressiblePhasicitySpontaneousPropertiesSummary +----------+------------+---------+-----------+----------+-------+ IJV           Full       Yes       Yes                      +----------+------------+---------+-----------+----------+-------+ Subclavian    Full       Yes       Yes                      +----------+------------+---------+-----------+----------+-------+ Axillary      Full       Yes  Yes                      +----------+------------+---------+-----------+----------+-------+ Brachial      Full       Yes       Yes                      +----------+------------+---------+-----------+----------+-------+ Radial        Full                                          +----------+------------+---------+-----------+----------+-------+ Ulnar         Full                                          +----------+------------+---------+-----------+----------+-------+ Cephalic      Full                                          +----------+------------+---------+-----------+----------+-------+ Basilic       Full                                          +----------+------------+---------+-----------+----------+-------+  Left Findings: +----------+------------+---------+-----------+----------+-------+ LEFT      CompressiblePhasicitySpontaneousPropertiesSummary +----------+------------+---------+-----------+----------+-------+ Subclavian               Yes       Yes                      +----------+------------+---------+-----------+----------+-------+  Summary:  Right: No evidence of deep vein thrombosis in the upper extremity. No evidence of superficial vein thrombosis in the upper extremity.  Left: No evidence of thrombosis in the subclavian.  *See  table(s) above for measurements and observations.    Preliminary     Procedures Procedures    Medications Ordered in ED Medications  HYDROcodone-acetaminophen (NORCO/VICODIN) 5-325 MG per tablet 2 tablet (has no administration in time range)  ketorolac (TORADOL) 30 MG/ML injection 60 mg (has no administration in time range)    ED Course/ Medical Decision Making/ A&P Clinical Course as of 10/27/22 1114  Thu Oct 26, 2022  2144 Patient has a nonspecific EKG with no priors to compare with.  We will grab a troponin just to make sure were not missing some ischemia.  Symptoms are likely musculoskeletal and CK normal no evidence of rhabdo.  Given shot of Toradol. [MB]  2316 Patient's troponin is 6.  Do not feel she needs a delta.  She said she had good relief with the Toradol.  Recommended symptomatic treatment and outpatient follow-up and she is comfortable plan. [MB]    Clinical Course User Index [MB] Terrilee Files, MD                           Medical Decision Making Amount and/or Complexity of Data Reviewed Labs: ordered.  Risk Prescription drug management.   This patient complains of bilateral hand pain and swelling, left shoulder pain, jaw pain; this involves an extensive number of treatment Options and is a complaint that  carries with it a high risk of complications and morbidity. The differential includes musculoskeletal pain, rhabdomyolysis, clot, ACS, radiculopathy  I ordered, reviewed and interpreted labs, which included CBC with mildly elevated white count normal hemoglobin, chemistries and LFTs normal, CK normal, troponin unremarkable I ordered medication oral pain medicine and IM Toradol with improvement in her symptoms and reviewed PMP when indicated. I ordered imaging studies which included x-rays of hand and duplex of left upper extremity and I independently    visualized and interpreted imaging which showed no acute findings Additional history obtained from  patient's husband Previous records obtained and reviewed in epic no recent admissions Social determinants considered, no significant barriers Critical Interventions: None  After the interventions stated above, I reevaluated the patient and found patient to be symptomatically improved Admission and further testing considered, no indications for further work-up at this time.  Recommended close follow-up with PCP.  Return instructions discussed.         Final Clinical Impression(s) / ED Diagnoses Final diagnoses:  Bilateral hand swelling  Acute pain of left shoulder  Jaw pain    Rx / DC Orders ED Discharge Orders     None         Hayden Rasmussen, MD 10/27/22 1116

## 2022-10-26 NOTE — Discharge Instructions (Signed)
You were seen in the emergency department for swelling and pain in your hands left shoulder and jaw.  You had blood work and x-rays ultrasound of your left arm did not show any significant abnormalities.  This is likely muscular pain.  Please continue Tylenol and ibuprofen, ice to the affected areas.  Follow-up with your regular doctor.  Return to the emergency department if any worsening or concerning symptoms

## 2022-11-06 ENCOUNTER — Other Ambulatory Visit: Payer: Self-pay

## 2022-11-06 ENCOUNTER — Emergency Department (HOSPITAL_COMMUNITY)
Admission: EM | Admit: 2022-11-06 | Discharge: 2022-11-07 | Disposition: A | Discharge status: Home / Self Care | Payer: 59 | Attending: Emergency Medicine | Admitting: Emergency Medicine

## 2022-11-06 ENCOUNTER — Encounter (HOSPITAL_COMMUNITY): Payer: Self-pay

## 2022-11-06 DIAGNOSIS — R700 Elevated erythrocyte sedimentation rate: Secondary | ICD-10-CM | POA: Diagnosis not present

## 2022-11-06 DIAGNOSIS — M25561 Pain in right knee: Secondary | ICD-10-CM | POA: Diagnosis not present

## 2022-11-06 DIAGNOSIS — M25512 Pain in left shoulder: Secondary | ICD-10-CM | POA: Insufficient documentation

## 2022-11-06 DIAGNOSIS — M25572 Pain in left ankle and joints of left foot: Secondary | ICD-10-CM | POA: Diagnosis not present

## 2022-11-06 DIAGNOSIS — M25542 Pain in joints of left hand: Secondary | ICD-10-CM | POA: Insufficient documentation

## 2022-11-06 DIAGNOSIS — M7989 Other specified soft tissue disorders: Secondary | ICD-10-CM | POA: Diagnosis not present

## 2022-11-06 DIAGNOSIS — D72829 Elevated white blood cell count, unspecified: Secondary | ICD-10-CM | POA: Insufficient documentation

## 2022-11-06 DIAGNOSIS — M2550 Pain in unspecified joint: Secondary | ICD-10-CM

## 2022-11-06 DIAGNOSIS — M25461 Effusion, right knee: Secondary | ICD-10-CM | POA: Diagnosis not present

## 2022-11-06 DIAGNOSIS — R7982 Elevated C-reactive protein (CRP): Secondary | ICD-10-CM | POA: Diagnosis not present

## 2022-11-06 DIAGNOSIS — M25472 Effusion, left ankle: Secondary | ICD-10-CM | POA: Insufficient documentation

## 2022-11-06 DIAGNOSIS — M25541 Pain in joints of right hand: Secondary | ICD-10-CM | POA: Insufficient documentation

## 2022-11-06 DIAGNOSIS — R531 Weakness: Secondary | ICD-10-CM | POA: Diagnosis not present

## 2022-11-06 LAB — CBC WITH DIFFERENTIAL/PLATELET
Abs Immature Granulocytes: 0.08 10*3/uL — ABNORMAL HIGH (ref 0.00–0.07)
Basophils Absolute: 0 10*3/uL (ref 0.0–0.1)
Basophils Relative: 0 %
Eosinophils Absolute: 0 10*3/uL (ref 0.0–0.5)
Eosinophils Relative: 0 %
HCT: 41 % (ref 36.0–46.0)
Hemoglobin: 13 g/dL (ref 12.0–15.0)
Immature Granulocytes: 1 %
Lymphocytes Relative: 13 %
Lymphs Abs: 1.7 10*3/uL (ref 0.7–4.0)
MCH: 28.2 pg (ref 26.0–34.0)
MCHC: 31.7 g/dL (ref 30.0–36.0)
MCV: 88.9 fL (ref 80.0–100.0)
Monocytes Absolute: 0.9 10*3/uL (ref 0.1–1.0)
Monocytes Relative: 7 %
Neutro Abs: 10 10*3/uL — ABNORMAL HIGH (ref 1.7–7.7)
Neutrophils Relative %: 79 %
Platelets: 544 10*3/uL — ABNORMAL HIGH (ref 150–400)
RBC: 4.61 MIL/uL (ref 3.87–5.11)
RDW: 13.3 % (ref 11.5–15.5)
WBC: 12.8 10*3/uL — ABNORMAL HIGH (ref 4.0–10.5)
nRBC: 0 % (ref 0.0–0.2)

## 2022-11-06 LAB — BASIC METABOLIC PANEL
Anion gap: 11 (ref 5–15)
BUN: 13 mg/dL (ref 6–20)
CO2: 23 mmol/L (ref 22–32)
Calcium: 9.6 mg/dL (ref 8.9–10.3)
Chloride: 102 mmol/L (ref 98–111)
Creatinine, Ser: 0.68 mg/dL (ref 0.44–1.00)
GFR, Estimated: >60 mL/min (ref >60)
Glucose, Bld: 121 mg/dL — ABNORMAL HIGH (ref 70–99)
Potassium: 3.9 mmol/L (ref 3.5–5.1)
Sodium: 136 mmol/L (ref 135–145)

## 2022-11-06 LAB — SEDIMENTATION RATE: Sed Rate: 119 mm/hr — ABNORMAL HIGH (ref 0–22)

## 2022-11-06 LAB — C-REACTIVE PROTEIN: CRP: 16 mg/dL — ABNORMAL HIGH (ref <1.0)

## 2022-11-06 MED ORDER — OXYCODONE-ACETAMINOPHEN 5-325 MG PO TABS
1.0000 | ORAL_TABLET | Freq: Once | ORAL | Status: AC
  Administered 2022-11-06: 1 via ORAL
  Filled 2022-11-06: qty 1

## 2022-11-06 NOTE — ED Triage Notes (Signed)
Patient said for a couple weeks she has had weakness on the left side of her body, arms and legs. When she is touched, she screams in pain. She got autoimmune testing done 2 weeks ago and had abnormal results. She cannot bear weight with her left leg or arm today because of the pain.

## 2022-11-06 NOTE — ED Provider Triage Note (Signed)
Emergency Medicine Provider Triage Evaluation Note  Morgan Conrad , a 31 y.o. female  was evaluated in triage.  Pt complains of 2 weeks of left-sided arthralgias.  She says at first she started having swelling in both of her hands, however the right hand got better, but the left hand started having pain in the left wrist.  She also has severe pain in the left shoulder and left ankle.  She was seen by urgent care to provider who thinks she may have some sort of rheumatological disorder.  She says that she was prescribed hydrocodone and naproxen for pain and does not have any relief at home.  She has her rheumatology appointment tomorrow..  Review of Systems  Positive:  Negative:   Physical Exam  BP 123/84   Pulse (!) 140   Temp 98.2 F (36.8 C) (Oral)   Resp 18   SpO2 94%  Gen:   Awake, no distress   Resp:  Normal effort  MSK:   Moves extremities without difficulty  Other:    Medical Decision Making  Medically screening exam initiated at 1:36 PM.  Appropriate orders placed.  Asaiah Hunnicutt was informed that the remainder of the evaluation will be completed by another provider, this initial triage assessment does not replace that evaluation, and the importance of remaining in the ED until their evaluation is complete.     Claudie Leach, PA-C 11/06/22 1337

## 2022-11-07 MED ORDER — METHYLPREDNISOLONE SODIUM SUCC 125 MG IJ SOLR
125.0000 mg | Freq: Once | INTRAMUSCULAR | Status: AC
  Administered 2022-11-07: 125 mg via INTRAMUSCULAR
  Filled 2022-11-07: qty 2

## 2022-11-07 MED ORDER — HYDROXYZINE HCL 25 MG PO TABS: 25.0000 mg | ORAL_TABLET | Freq: Four times a day (QID) | ORAL | 0 refills | Status: DC

## 2022-11-07 MED ORDER — METHYLPREDNISOLONE SODIUM SUCC 125 MG IJ SOLR: 125.0000 mg | Freq: Once | INTRAMUSCULAR | Status: DC

## 2022-11-07 MED ORDER — HYDROXYZINE HCL 10 MG PO TABS
10.0000 mg | ORAL_TABLET | Freq: Once | ORAL | Status: AC
  Administered 2022-11-07: 10 mg via ORAL
  Filled 2022-11-07: qty 1

## 2022-11-07 NOTE — ED Provider Notes (Signed)
[facility] DEPT Provider Note   CSN: [id number] Arrival date & time: 11/06/22  1301     History  Chief Complaint  Patient presents with   Weakness    Morgan Conrad is a 31 y.o. female with no significant past medical history presents the emergency department complaining of pain of multiple joints.  Patient states that several weeks ago she started having pain in her left shoulder, following that had pain in her bilateral hands, right knee, and left ankle.  Denies any fevers or recent illness.  She was seen in the ER several weeks ago, discharged after she was told that her workup was normal.  She went to urgent care, and they prescribed her naproxen and hydrocodone.  They had done some initial blood work and were concerned for rheumatologic conditions, and referred her to a rheumatologist.  She has a appointment with her rheumatologist tomorrow.  She is having very difficult time sleeping due to her joint pain.   Weakness Associated symptoms: arthralgias   Associated symptoms: no fever        Home Medications Prior to Admission medications   Medication Sig Start Date End Date Taking? Authorizing Provider  hydrOXYzine (ATARAX) 25 MG tablet Take 1 tablet (25 mg total) by mouth every 6 (six) hours. 11/07/22  Yes Mildred Tuccillo T, PA-C      Allergies    Patient has no known allergies.    Review of Systems   Review of Systems  Constitutional:  Negative for chills and fever.  Musculoskeletal:  Positive for arthralgias.  Neurological:  Positive for weakness.  All other systems reviewed and are negative.   Physical Exam Updated Vital Signs BP (!) 124/101   Pulse 98   Temp 98.3 F (36.8 C) (Oral)   Resp 14   SpO2 99%  Physical Exam Vitals and nursing note reviewed.  Constitutional:      Appearance: Normal appearance.  HENT:     Head: Normocephalic and atraumatic.  Eyes:     Conjunctiva/sclera: Conjunctivae normal.  Pulmonary:      Effort: Pulmonary effort is normal. No respiratory distress.  Musculoskeletal:     Comments: Swelling noted to left hand, left ankle, and right knee with significant tenderness to light touch. No erythema or breaks in the skin.   Skin:    General: Skin is warm and dry.  Neurological:     Mental Status: She is alert.  Psychiatric:        Mood and Affect: Mood normal.        Behavior: Behavior normal.     ED Results / Procedures / Treatments   Labs (all labs ordered are listed, but only abnormal results are displayed) Labs Reviewed  BASIC METABOLIC PANEL - Abnormal; Notable for the following components:      Result Value   Glucose, Bld 121 (*)    All other components within normal limits  CBC WITH DIFFERENTIAL/PLATELET - Abnormal; Notable for the following components:   WBC 12.8 (*)    Platelets 544 (*)    Neutro Abs 10.0 (*)    Abs Immature Granulocytes 0.08 (*)    All other components within normal limits  C-REACTIVE PROTEIN - Abnormal; Notable for the following components:   CRP 16.0 (*)    All other components within normal limits  SEDIMENTATION RATE - Abnormal; Notable for the following components:   Sed Rate 119 (*)    All other components within normal limits    EKG  None  Radiology No results found.  Procedures Procedures    Medications Ordered in ED Medications  hydrOXYzine (ATARAX) tablet 10 mg (has no administration in time range)  methylPREDNISolone sodium succinate (SOLU-MEDROL) 125 mg/2 mL injection 125 mg (has no administration in time range)  oxyCODONE-acetaminophen (PERCOCET/ROXICET) 5-325 MG per tablet 1 tablet (1 tablet Oral Given 11/06/22 1859)    ED Course/ Medical Decision Making/ A&P                           Medical Decision Making Risk Prescription drug management.  This patient is a 31 y.o. female who presents to the ED for concern of polyarthralgias.   Differential diagnoses prior to evaluation: Autoimmune conditions, septic  arthritis, viral illness  Past Medical History / Social History / Additional history: Chart reviewed. Pertinent results include: no significant PMH  Physical Exam: Physical exam performed. The pertinent findings include: Swelling noted to multiple joints with tenderness, no overlying skin changes.   Labs/imaging: I personally ordered and interpreted labs with pertinent findings: Mild leukocytosis of 12.8, BMP unremarkable.  CRP 16, sedimentation rate 119.  Medications / Treatment: Given high dose steroids and hydroxyzine for anxiety/sleep   Disposition: After consideration of the diagnostic results and the patients response to treatment, I feel that emergency department workup does not suggest an emergent condition requiring admission or immediate intervention beyond what has been performed at this time. The plan is: discharge to home with continued symptomatic management of polyarthritis with rheumatology follow up tomorrow as scheduled. Low concern for septic joint as patient is otherwise well appearing with mild white count, no recent illness.  Elevated ESR CRP, likely in the setting of autoimmune/rheumatoid condition.  Will give short prescription of hydroxyzine to help with sleep, and recommended she continue taking previously prescribed occasions.  The patient is safe for discharge and has been instructed to return immediately for worsening symptoms, change in symptoms or any other concerns.  Final Clinical Impression(s) / ED Diagnoses Final diagnoses:  Polyarthralgia    Rx / DC Orders ED Discharge Orders          Ordered    hydrOXYzine (ATARAX) 25 MG tablet  Every 6 hours        11/07/22 0034           Portions of this report may have been transcribed using voice recognition software. Every effort was made to ensure accuracy; however, inadvertent computerized transcription errors may be present.    Estill Cotta 11/07/22 0051    Quintella Reichert, MD 11/08/22  [phone]

## 2022-11-07 NOTE — Discharge Instructions (Addendum)
You were seen in the emergency department today for your weakness and joint pain.  As we discussed I would be surprised if you have rheumatoid arthritis, another autoimmune condition.  It is important that you follow-up with the rheumatologist tomorrow at your scheduled appointment.  In the meantime we have given you a one-time dose of steroids, as well as a medication to help with your anxiety/insomnia.  You can continue taking anti-inflammatories and pain medication you are previously prescribed.  Continue to monitor how you're doing and return to the ER for new or worsening symptoms.

## 2022-11-15 ENCOUNTER — Ambulatory Visit: Admitting: Internal Medicine

## 2022-12-01 ENCOUNTER — Other Ambulatory Visit: Payer: Self-pay | Admitting: Rheumatology

## 2022-12-01 DIAGNOSIS — M25431 Effusion, right wrist: Secondary | ICD-10-CM

## 2022-12-01 DIAGNOSIS — M25531 Pain in right wrist: Secondary | ICD-10-CM

## 2022-12-11 ENCOUNTER — Encounter: Payer: Self-pay | Admitting: Internal Medicine

## 2022-12-11 ENCOUNTER — Ambulatory Visit: Payer: 59 | Admitting: Internal Medicine

## 2022-12-11 VITALS — BP 107/65 | HR 80 | Resp 16 | Ht 62.5 in | Wt 192.0 lb

## 2022-12-11 DIAGNOSIS — E669 Obesity, unspecified: Secondary | ICD-10-CM | POA: Insufficient documentation

## 2022-12-11 DIAGNOSIS — M069 Rheumatoid arthritis, unspecified: Secondary | ICD-10-CM | POA: Insufficient documentation

## 2022-12-11 DIAGNOSIS — M05732 Rheumatoid arthritis with rheumatoid factor of left wrist without organ or systems involvement: Secondary | ICD-10-CM | POA: Diagnosis not present

## 2022-12-11 MED ORDER — PREDNISONE 10 MG PO TABS: ORAL_TABLET | ORAL | 0 refills | Status: AC

## 2022-12-11 NOTE — Progress Notes (Signed)
   HPI:Morgan Conrad is a 32 y.o. female with recent diagnosis of rheumatoid arthritis who presents to establish care. For the details of today's visit, please refer to the assessment and plan.   Past Medical History:  Diagnosis Date   History of termination of pregnancy 01/09/2014    Past Surgical History:  Procedure Laterality Date   CESAREAN SECTION N/A 03/19/2015   Procedure: CESAREAN SECTION;  Surgeon: Truett Mainland, DO;  Location: [facility];  Service: Obstetrics;  Laterality: N/A;   KNEE SURGERY Right     Family History  Problem Relation Age of Onset   Hypertension Maternal Grandfather    Diabetes Maternal Grandfather    Asthma Mother     Social History   Tobacco Use   Smoking status: Former    Packs/day: 0.25    Years: 7.00    Total pack years: 1.75    Types: Cigarettes    Quit date: 04/14/2019    Years since quitting: 3.6   Smokeless tobacco: Never  Substance Use Topics   Alcohol use: Yes    Alcohol/week: 1.0 standard drink of alcohol    Types: 1 Glasses of wine per week   Drug use: No    Physical Exam: Vitals:   12/11/22 1311  BP: 107/65  Pulse: 80  Resp: 16  SpO2: 97%  Weight: 192 lb (87.1 kg)  Height: 5' 2.5" (1.588 m)     Physical Exam Constitutional:      Appearance: She is well-developed and well-groomed.  Eyes:     General: No scleral icterus.    Conjunctiva/sclera: Conjunctivae normal.  Cardiovascular:     Rate and Rhythm: Normal rate and regular rhythm.     Heart sounds: No murmur heard.    No friction rub. No gallop.  Pulmonary:     Effort: Pulmonary effort is normal.     Breath sounds: No wheezing, rhonchi or rales.  Musculoskeletal:        General: Swelling (left wrist, left ankle) and tenderness present.  Skin:    General: Skin is warm and dry.  Psychiatric:        Mood and Affect: Mood normal.        Behavior: Behavior normal.      Assessment & Plan:   Rheumatoid arthritis ([place]) Recent diagnosis. Rheumatologist is  Dr. Kathlene November at [facility]. Patient was started on Prednisone with taper and given steroid injections in left wrist a month ago. The wrist swelling improved some, but she continues to have swelling and pain of left wrist. It is difficult for her to move the wrist due to weakness and pain. She is wearing a wrist splint. She started Cimzia today. She will be getting weekly injections injections and then monthly.   Assessment/Plan:  On going pain and swelling in multiple joints ( left wrist, left ankle, first and second PIP joints of right foot). Likely will improve with Cimzia, but given immobility will prescribe prednisone with taper today.  - Voltaren gel as needed for pain - Prednisone with taper as prescribed - If not regaining strength as RA improves, please let us know and I will refer you for physical therapy  Obesity (BMI 30.0-34.9)  BMI 34. Will check labs below today.  - Lipid panel - VITAMIN D 25 Hydroxy (Vit-D Deficiency, Fractures) - TSH - Hemoglobin A1C    Lorene Dy, MD

## 2022-12-11 NOTE — Assessment & Plan Note (Addendum)
BMI 34. Will check labs below today.  - Lipid panel - VITAMIN D 25 Hydroxy (Vit-D Deficiency, Fractures) - TSH - Hemoglobin A1C

## 2022-12-11 NOTE — Patient Instructions (Addendum)
Thank you for trusting me with your care. To recap, today we discussed the following  Wrist pain - Voltaren gel as needed for pain - Prednisone with taper as prescribed - If not regaining strength as RA improves, please let us know and I will refer you for physical therapy  Routine labs today - Lipid panel - VITAMIN D 25 Hydroxy (Vit-D Deficiency, Fractures) - TSH - Hemoglobin A1C

## 2022-12-11 NOTE — Assessment & Plan Note (Addendum)
Recent diagnosis. Rheumatologist is Dr. Kathlene November at [facility]. Patient was started on Prednisone with taper and given steroid injections in left wrist a month ago. The wrist swelling improved some, but she continues to have swelling and pain of left wrist. It is difficult for her to move the wrist due to weakness and pain. She is wearing a wrist splint. She started Cimzia today. She will be getting weekly injections injections and then monthly.   Assessment/Plan:  On going pain and swelling in multiple joints ( left wrist, left ankle, first and second PIP joints of right foot). Likely will improve with Cimzia, but given immobility will prescribe prednisone with taper today.  - Voltaren gel as needed for pain - Prednisone with taper as prescribed - If not regaining strength as RA improves, please let us know and I will refer you for physical therapy

## 2022-12-12 ENCOUNTER — Other Ambulatory Visit: Payer: Self-pay | Admitting: Internal Medicine

## 2022-12-12 DIAGNOSIS — E559 Vitamin D deficiency, unspecified: Secondary | ICD-10-CM

## 2022-12-12 LAB — HEMOGLOBIN A1C
Est. average glucose Bld gHb Est-mCnc: 111 mg/dL
Hgb A1c MFr Bld: 5.5 % (ref 4.8–5.6)

## 2022-12-12 LAB — VITAMIN D 25 HYDROXY (VIT D DEFICIENCY, FRACTURES): Vit D, 25-Hydroxy: 12.7 ng/mL — ABNORMAL LOW (ref 30.0–100.0)

## 2022-12-12 LAB — LIPID PANEL
Chol/HDL Ratio: 3.1 ratio (ref 0.0–4.4)
Cholesterol, Total: 255 mg/dL — ABNORMAL HIGH (ref 100–199)
HDL: 82 mg/dL (ref >39)
LDL Chol Calc (NIH): 162 mg/dL — ABNORMAL HIGH (ref 0–99)
Triglycerides: 67 mg/dL (ref 0–149)
VLDL Cholesterol Cal: 11 mg/dL (ref 5–40)

## 2022-12-12 LAB — TSH: TSH: 0.148 u[IU]/mL — ABNORMAL LOW (ref 0.450–4.500)

## 2022-12-12 MED ORDER — VITAMIN D3 25 MCG (1000 UT) PO CAPS: 1000.0000 [IU] | ORAL_CAPSULE | Freq: Every day | ORAL | 1 refills | Status: AC

## 2023-03-26 ENCOUNTER — Ambulatory Visit: Payer: 59 | Admitting: Internal Medicine
